# Patient Record
Sex: Female | Born: 1945 | Race: White | Hispanic: No | Marital: Married | Smoking: Former smoker
Health system: Southern US, Community
[De-identification: ages and names within clinical notes are randomized; demographics above are authoritative.]

## PROBLEM LIST (undated history)

## (undated) DIAGNOSIS — E785 Hyperlipidemia, unspecified: Secondary | ICD-10-CM

## (undated) HISTORY — PX: SHOULDER SURGERY: SHX246

## (undated) HISTORY — PX: FOOT SURGERY: SHX648

## (undated) HISTORY — PX: ABDOMINAL HYSTERECTOMY: SHX81

---

## 1998-05-29 ENCOUNTER — Other Ambulatory Visit: Admission: RE | Admit: 1998-05-29 | Discharge: 1998-05-29 | Payer: Self-pay | Admitting: Obstetrics and Gynecology

## 1999-03-19 ENCOUNTER — Inpatient Hospital Stay (HOSPITAL_COMMUNITY): Admission: RE | Admit: 1999-03-19 | Discharge: 1999-03-21 | Payer: Self-pay | Admitting: Obstetrics and Gynecology

## 1999-03-19 ENCOUNTER — Encounter (INDEPENDENT_AMBULATORY_CARE_PROVIDER_SITE_OTHER): Payer: Self-pay

## 1999-09-11 ENCOUNTER — Other Ambulatory Visit: Admission: RE | Admit: 1999-09-11 | Discharge: 1999-09-11 | Payer: Self-pay | Admitting: Family Medicine

## 2002-05-21 ENCOUNTER — Emergency Department (HOSPITAL_COMMUNITY): Admission: EM | Admit: 2002-05-21 | Discharge: 2002-05-21 | Payer: Self-pay | Admitting: Emergency Medicine

## 2003-05-07 ENCOUNTER — Other Ambulatory Visit: Admission: RE | Admit: 2003-05-07 | Discharge: 2003-05-07 | Payer: Self-pay | Admitting: Obstetrics and Gynecology

## 2005-05-20 ENCOUNTER — Other Ambulatory Visit: Admission: RE | Admit: 2005-05-20 | Discharge: 2005-05-20 | Payer: Self-pay | Admitting: Family Medicine

## 2007-11-07 ENCOUNTER — Encounter: Admission: RE | Admit: 2007-11-07 | Discharge: 2007-11-07 | Payer: Self-pay | Admitting: Family Medicine

## 2007-12-07 ENCOUNTER — Encounter: Admission: RE | Admit: 2007-12-07 | Discharge: 2007-12-07 | Payer: Self-pay | Admitting: Orthopedic Surgery

## 2009-11-02 IMAGING — CR DG LUMBAR SPINE COMPLETE 4+V
5 series · 5 of 5 positions shown · non-contrast
Comparison: None

CLINICAL DATA: Back pain and bilateral hip pain.

LUMBAR SPINE - COMPLETE 4+ VIEW

[t l-spine a.p.]
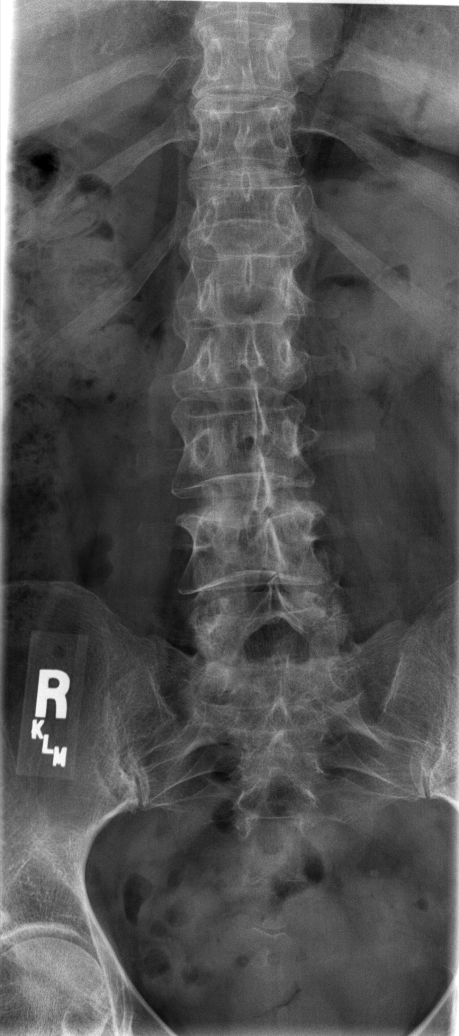

[t l-spine oblique exposure (1 of 2)]
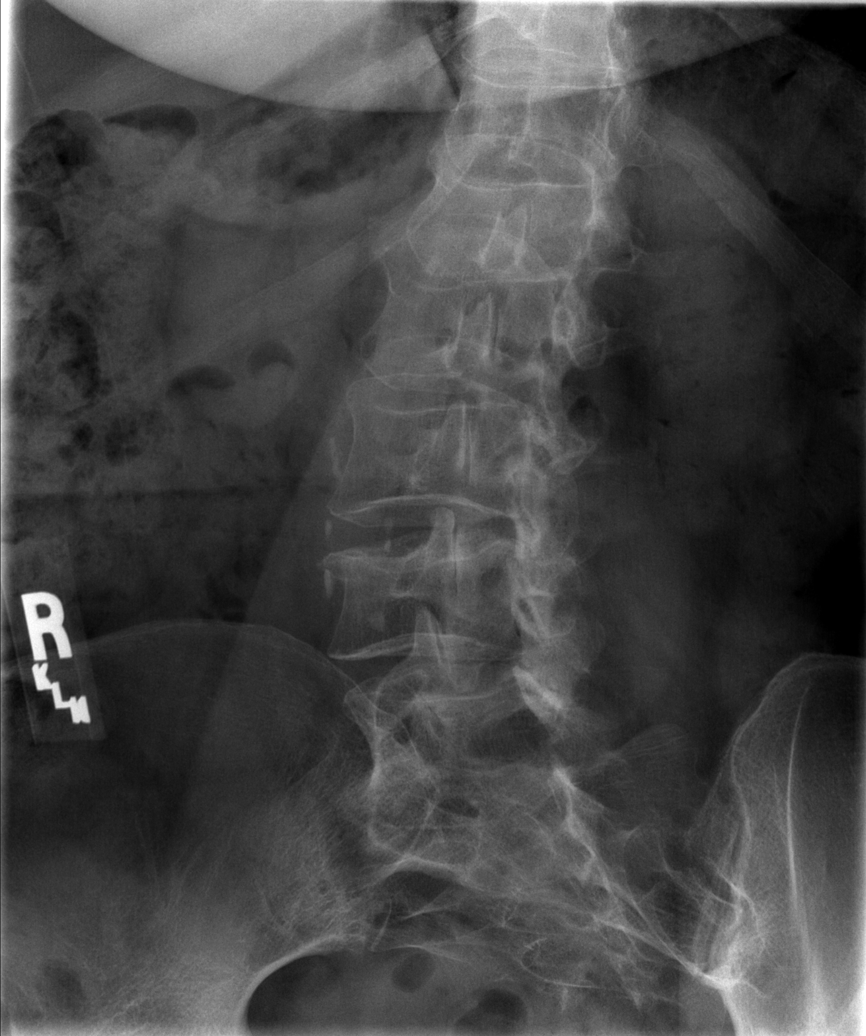

[t l-spine oblique exposure (2 of 2)]
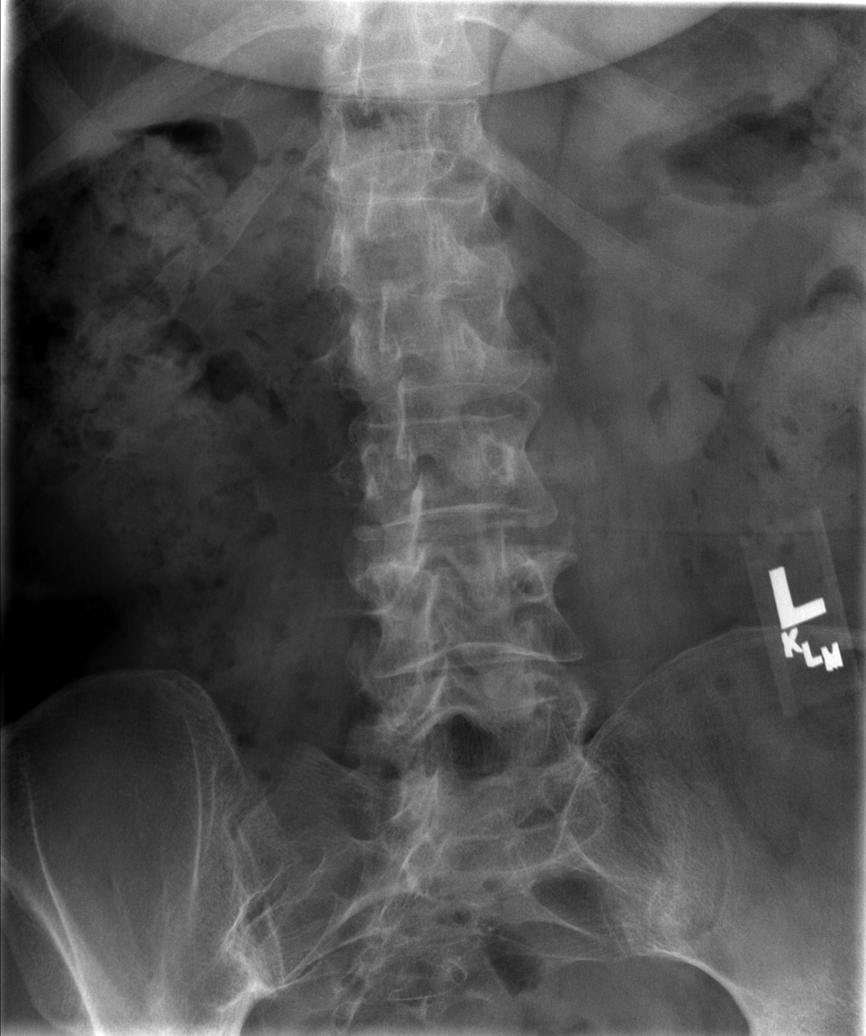

[t l-spine lat]
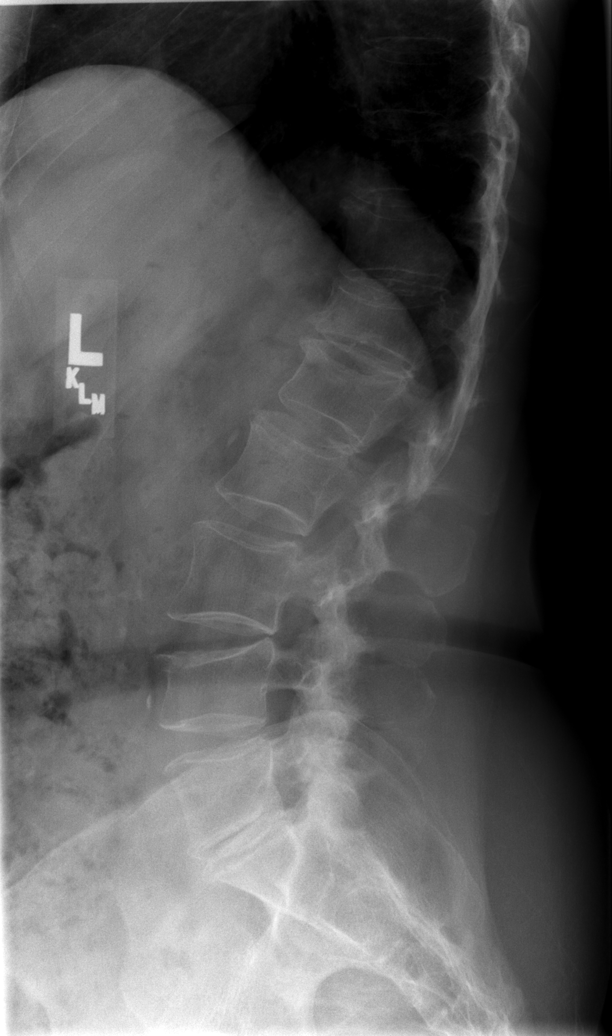

[t l-spine l5-s1 spot]
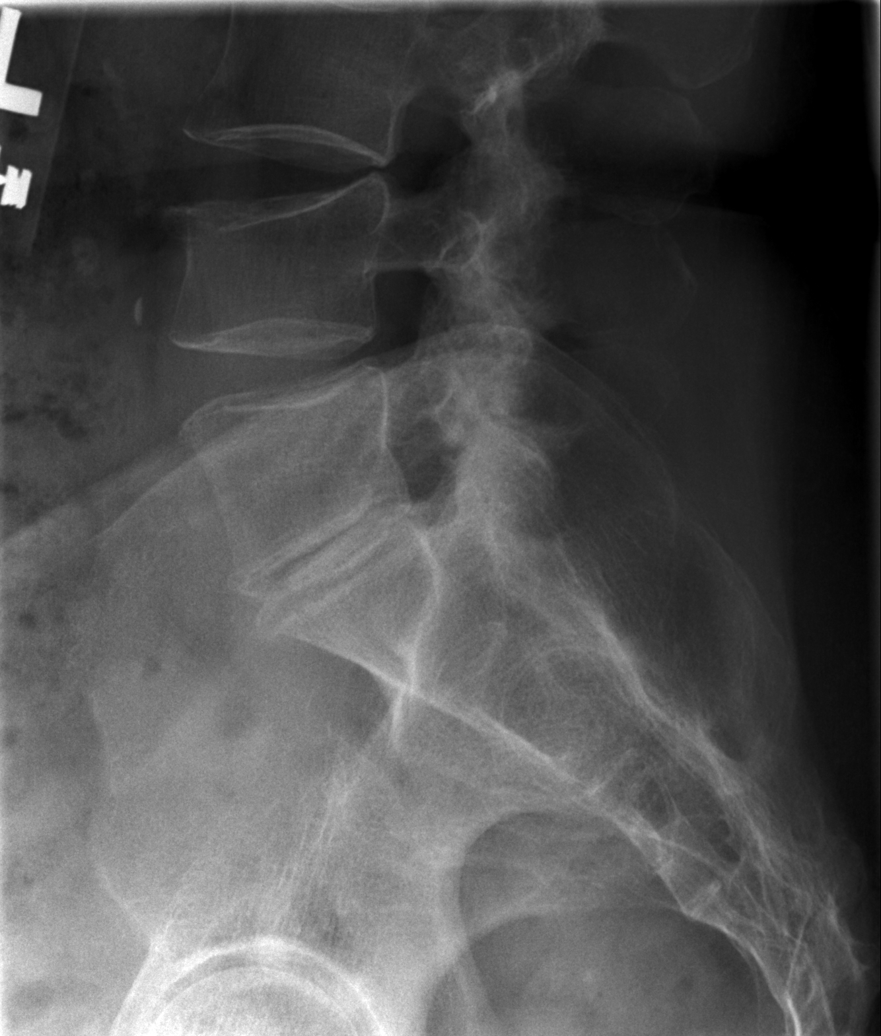

[5 of 5 positions shown; findings below may reference images not displayed]

FINDINGS: There is mild compression of the T12 vertebral body.
Mild compression of the superior endplate of L4 is seen as well.
Without prior imaging, these are considered age indeterminate.
Multilevel endplate degenerative changes and facet hypertrophy are
seen as well.  Loss of disc space height is worst at L5-S1.  Very
mild rightward curvature of the lumbar spine.
IMPRESSION: 1.  Age indeterminate compression fractures at T12 and L4, as
above.
2.  Spondylosis, worst at L5-S1.

## 2010-07-25 NOTE — Discharge Summary (Signed)
[facility] [address] [facility]  Patient:    Amber Norris                         MRN: [id number] Adm. Date:  [id number] Disc. Date: [id number] Attending:  Morene Antu                           Discharge Summary  PROBLEM:                      Vaginal prolapse.  SUBJECTIVE:                   Patient is a 65 year old gravida 2 para 2-0-0-2 woman who has had worsening vaginal prolapse over several years.  The patient has complained about this, with significant pelvic pain and pelvic pressure. Because of this, the patient requests surgical treatment at this time.  OBJECTIVE:  PHYSICAL EXAMINATION:  HEENT:                        Within normal limits.  NECK:                         Without any lymphadenopathy.  Thyroid without nodule.  CHEST:                        Clear to auscultation.  HEART:                        Respiratory rhythm without murmur.  BREAST:                       Without mass.  CVA:                          Nontender.  ABDOMEN:                      Soft, nontender, without mass.  PELVIC:                       External genitalia within normal limits.  Vagina,  third to fourth degree cystocele.  Cervix within normal limits.  Uterus mid normal size, nontender, mobile.  Adnexa without mass.  Rectovaginal exam without nodule.  HOSPITAL COURSE:              Patient was admitted and brought to the operating  room where a vaginal hysterectomy, bilateral salpingo-oophorectomy, and anterior and posterior repair were performed.  There were no complications from surgery, and postoperatively the patient did very well.  She remained afebrile and her vital  signs remained stable.  Pathology revealed cervix within normal limits; benign endometrium; myometrium, no pathological abnormalities.  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  Left fallopian tube and ovary normal.  Preoperative  hematocrit was 43.7, postoperative hematocrit on postoperative day #1 was 35.0.  Patient began urinating on postoperative day #2.  I&O catheterizations were performed for residuals.  Less  than 100 cc residual was present.  Therefore, the patient was able to be discharged to home without any self-catheterization.  ASSESSMENT:                   Stable,  status post vaginal hysterectomy, BSO, and anterior and posterior repair.  PLAN:                         Follow up in the office in two weeks.  The patient will call if she has a temperature greater than 101, severe pain, or heavy bleeding.  DISCHARGE MEDICATIONS:        She is discharged home on Tylox to take 1-2 q.4h. p.r.n. DD:  04/09/99 TD:  04/09/99 Job: [id number]

## 2010-07-25 NOTE — Op Note (Signed)
[facility]  Patient:    Amber Norris                         MRN: [id number] Proc. Date: 03/19/99 Adm. Date:  [id number] Attending:  Morene Antu                           Operative Report  PREOPERATIVE DIAGNOSIS:       Vaginal prolapse.  POSTOPERATIVE DIAGNOSIS:      Vaginal prolapse.  OPERATION:                    Vaginal hysterectomy, BSO, anterior and posterior  repair.  SURGEON:                      Sherry A. Rosalio Macadamia, M.D.  ASSISTANT:                    Sung Amabile. Roslyn Smiling, M.D.  ANESTHESIA:                   General anesthesia.  ESTIMATED BLOOD LOSS:  INDICATIONS:                  This is a 65 year old, gravida 2, para 2-0-0-2, woman who has had worsening vaginal prolapse over several years.  The patient has complained about this for several years, but has gotten significantly worse recently.  Because of the pelvic pressure and feeling as though the vaginal tissue is dropping out of the vagina, the patient requests hysterectomy and repair at his time.  FINDINGS:                     Third degree cervical prolapse.  Third to fourth degree cystocele.  Second degree rectocele.  Normal tubes and ovaries.  DESCRIPTION OF PROCEDURE:     The patient was brought into the operating room and given adequate general anesthesia.  She was placed in the dorsal lithotomy position.  Her perineum and vagina were washed with Betadine.  The bladder was in-and-out catheterized.  The patient was draped in a sterile fashion.  The speculum was placed within the vagina.  The cervix was grasped with a Jacobsen tenaculum and the cervix was circumcised circumferentially.  The vaginal mucosa was pushed off of the cervix.  The posterior peritoneum was entered sharply.  The uterosacral ligaments were clamped, cut, and suture ligated with 0 Vicryl ligatures.  The vaginal mucosa was dissected off the cervix.  The bladder pillars were clamped, cut, and  suture ligated with 0 Vicryl ligature.  The anterior peritoneum was then identified and entered.  A retractor was placed within the space.  On alternating sides, cardinal ligaments were clamped, cut, and suture ligated with 0 Vicryl ligatures.  The utero-ovarian ligaments were doubly clamped, cut, and suture ligated with 0 Vicryl and free tied with #1 Vicryl.  Once the uterus was removed in this fashion, the infundibulopelvic ligaments could be identified.  They were doubly clamped.  The tubes and ovaries were excised. They were tied with 1 Vicryl free tie and then suture ligated with 0 Vicryl ligatures. There was some small bleeding near the right infundibulopelvic ligament and this was free tied with 2-0 Vicryl.  The vaginal mucosa was then closed with #0 Vicryl in a running locked stitch.  There was still some bleeding near the right infundibulopelvic ligament.  A figure-of-eight suture was taken with 0 Vicryl and adequate hemostasis was present.  The peritoneal stitch was then placed with 2-0 Vicryl and left in place as a pursestring.  The uterosacral plication stitches ere taken across the posterior peritoneum x 2.  The stitches were then tied.  The peritoneal stitch was tied.  Cystocele was then repaired by clamping the edges of the vaginal mucosa in the midline and dissecting the vaginal mucosa off the bladder in the midline.  This was performed up to within about 1 cm of the urethra, but approximately 2 cm away from the meatus.  The vaginal mucosa was dissected off of the bladder with blunt and  sharp dissection.  The small bleeders were stopped by using 3-0 chromic in figure-of-eight stitches.  A Kelly plication stitch beneath the urethra was taken with #2-0 Vicryl in a mattress type stitch.  The plication stitches were taken across the bladder closing the endopelvic fascia beneath the bladder for support using 2-0 chromic stitches.  The excess vaginal mucosa was  excised.  The vaginal mucosa was then closed in the midline with 2-0 chromic in a running locked stitch. Adequate hemostasis was present.  Attention was turned to the small rectocele.  Allis clamps were placed on either side of the introitus.  The perineal epithelium was incised and excised in a triangular fashion.  The vaginal mucosa was dissected off of the rectum in the midline with blunt and sharp dissection and this was performed over approximately 4 to 5 cm.  The mucosa was incised in the midline and the vaginal mucosa was dissected free with blunt and sharp dissection laterally.  A pursestring stitch was taken with 2-0 chromic in a pursestring stitch across the endopelvic fascia for  support of the rectum.  The stitches were taken with #0 Vicryl in a mattress type stitches in the levator ani muscles for support across the perineum.  The excess vaginal tissue was excised.  The vagina was closed with 3-0 chromic in a running locked stitch.  The perineum was closed with 3-0 chromic in a subcutaneous subcuticular stitch.  Adequate hemostasis was present.  Foley catheter was placed in the bladder.  A two inch plain gauze was placed within the vagina for packing. The patient was taken out of the dorsal lithotomy position.  She was awakened. She was extubated.  She was removed from the operating table to a stretcher in stable condition.  Complications were none.  Estimated blood loss was 75 cc. DD:  03/19/99 TD:  03/19/99 Job: [id number]

## 2020-04-18 DIAGNOSIS — H401131 Primary open-angle glaucoma, bilateral, mild stage: Secondary | ICD-10-CM | POA: Diagnosis not present

## 2020-05-09 DIAGNOSIS — Z1231 Encounter for screening mammogram for malignant neoplasm of breast: Secondary | ICD-10-CM | POA: Diagnosis not present

## 2020-09-03 DIAGNOSIS — R194 Change in bowel habit: Secondary | ICD-10-CM | POA: Diagnosis not present

## 2020-09-03 DIAGNOSIS — Z86010 Personal history of colonic polyps: Secondary | ICD-10-CM | POA: Diagnosis not present

## 2020-09-03 DIAGNOSIS — E739 Lactose intolerance, unspecified: Secondary | ICD-10-CM | POA: Diagnosis not present

## 2020-10-22 DIAGNOSIS — H401131 Primary open-angle glaucoma, bilateral, mild stage: Secondary | ICD-10-CM | POA: Diagnosis not present

## 2020-11-12 DIAGNOSIS — H43813 Vitreous degeneration, bilateral: Secondary | ICD-10-CM | POA: Diagnosis not present

## 2020-11-12 DIAGNOSIS — H401131 Primary open-angle glaucoma, bilateral, mild stage: Secondary | ICD-10-CM | POA: Diagnosis not present

## 2020-11-12 DIAGNOSIS — H2513 Age-related nuclear cataract, bilateral: Secondary | ICD-10-CM | POA: Diagnosis not present

## 2020-12-02 DIAGNOSIS — N390 Urinary tract infection, site not specified: Secondary | ICD-10-CM | POA: Diagnosis not present

## 2021-01-01 DIAGNOSIS — Z23 Encounter for immunization: Secondary | ICD-10-CM | POA: Diagnosis not present

## 2021-01-01 DIAGNOSIS — K589 Irritable bowel syndrome without diarrhea: Secondary | ICD-10-CM | POA: Diagnosis not present

## 2021-01-01 DIAGNOSIS — H409 Unspecified glaucoma: Secondary | ICD-10-CM | POA: Diagnosis not present

## 2021-01-01 DIAGNOSIS — M79672 Pain in left foot: Secondary | ICD-10-CM | POA: Diagnosis not present

## 2021-01-01 DIAGNOSIS — K219 Gastro-esophageal reflux disease without esophagitis: Secondary | ICD-10-CM | POA: Diagnosis not present

## 2021-01-01 DIAGNOSIS — D229 Melanocytic nevi, unspecified: Secondary | ICD-10-CM | POA: Diagnosis not present

## 2021-01-01 DIAGNOSIS — Z0000 Encounter for general adult medical examination without abnormal findings: Secondary | ICD-10-CM | POA: Diagnosis not present

## 2021-01-01 DIAGNOSIS — D241 Benign neoplasm of right breast: Secondary | ICD-10-CM | POA: Diagnosis not present

## 2021-01-01 DIAGNOSIS — M8580 Other specified disorders of bone density and structure, unspecified site: Secondary | ICD-10-CM | POA: Diagnosis not present

## 2021-01-01 DIAGNOSIS — Z1389 Encounter for screening for other disorder: Secondary | ICD-10-CM | POA: Diagnosis not present

## 2021-01-01 DIAGNOSIS — E7800 Pure hypercholesterolemia, unspecified: Secondary | ICD-10-CM | POA: Diagnosis not present

## 2021-01-01 DIAGNOSIS — M150 Primary generalized (osteo)arthritis: Secondary | ICD-10-CM | POA: Diagnosis not present

## 2021-01-02 DIAGNOSIS — H2512 Age-related nuclear cataract, left eye: Secondary | ICD-10-CM | POA: Diagnosis not present

## 2021-01-02 DIAGNOSIS — H401121 Primary open-angle glaucoma, left eye, mild stage: Secondary | ICD-10-CM | POA: Diagnosis not present

## 2021-01-14 DIAGNOSIS — H2511 Age-related nuclear cataract, right eye: Secondary | ICD-10-CM | POA: Diagnosis not present

## 2021-01-14 DIAGNOSIS — N6489 Other specified disorders of breast: Secondary | ICD-10-CM | POA: Diagnosis not present

## 2021-01-14 DIAGNOSIS — R928 Other abnormal and inconclusive findings on diagnostic imaging of breast: Secondary | ICD-10-CM | POA: Diagnosis not present

## 2021-01-16 DIAGNOSIS — H401111 Primary open-angle glaucoma, right eye, mild stage: Secondary | ICD-10-CM | POA: Diagnosis not present

## 2021-01-16 DIAGNOSIS — H2511 Age-related nuclear cataract, right eye: Secondary | ICD-10-CM | POA: Diagnosis not present

## 2021-02-10 ENCOUNTER — Ambulatory Visit: Payer: Medicare Other | Admitting: Podiatry

## 2021-03-31 DIAGNOSIS — L570 Actinic keratosis: Secondary | ICD-10-CM | POA: Diagnosis not present

## 2021-03-31 DIAGNOSIS — D1801 Hemangioma of skin and subcutaneous tissue: Secondary | ICD-10-CM | POA: Diagnosis not present

## 2021-03-31 DIAGNOSIS — L309 Dermatitis, unspecified: Secondary | ICD-10-CM | POA: Diagnosis not present

## 2021-03-31 DIAGNOSIS — L578 Other skin changes due to chronic exposure to nonionizing radiation: Secondary | ICD-10-CM | POA: Diagnosis not present

## 2021-03-31 DIAGNOSIS — L821 Other seborrheic keratosis: Secondary | ICD-10-CM | POA: Diagnosis not present

## 2021-04-23 DIAGNOSIS — H401131 Primary open-angle glaucoma, bilateral, mild stage: Secondary | ICD-10-CM | POA: Diagnosis not present

## 2021-05-20 DIAGNOSIS — R300 Dysuria: Secondary | ICD-10-CM | POA: Diagnosis not present

## 2021-05-22 DIAGNOSIS — Z1231 Encounter for screening mammogram for malignant neoplasm of breast: Secondary | ICD-10-CM | POA: Diagnosis not present

## 2021-06-19 DIAGNOSIS — S68626A Partial traumatic transphalangeal amputation of right little finger, initial encounter: Secondary | ICD-10-CM | POA: Diagnosis not present

## 2021-06-19 DIAGNOSIS — Z23 Encounter for immunization: Secondary | ICD-10-CM | POA: Diagnosis not present

## 2021-06-19 DIAGNOSIS — S61316A Laceration without foreign body of right little finger with damage to nail, initial encounter: Secondary | ICD-10-CM | POA: Diagnosis not present

## 2021-07-07 DIAGNOSIS — E7800 Pure hypercholesterolemia, unspecified: Secondary | ICD-10-CM | POA: Diagnosis not present

## 2021-08-18 DIAGNOSIS — H401131 Primary open-angle glaucoma, bilateral, mild stage: Secondary | ICD-10-CM | POA: Diagnosis not present

## 2021-09-26 DIAGNOSIS — H401131 Primary open-angle glaucoma, bilateral, mild stage: Secondary | ICD-10-CM | POA: Diagnosis not present

## 2021-10-27 DIAGNOSIS — H401131 Primary open-angle glaucoma, bilateral, mild stage: Secondary | ICD-10-CM | POA: Diagnosis not present

## 2022-01-21 DIAGNOSIS — R079 Chest pain, unspecified: Secondary | ICD-10-CM | POA: Diagnosis not present

## 2022-02-05 ENCOUNTER — Encounter: Payer: Self-pay | Admitting: Emergency Medicine

## 2022-02-05 ENCOUNTER — Other Ambulatory Visit: Payer: Self-pay

## 2022-02-05 ENCOUNTER — Ambulatory Visit
Admission: EM | Admit: 2022-02-05 | Discharge: 2022-02-05 | Disposition: A | Discharge status: Home / Self Care | Payer: Medicare Other | Attending: Family Medicine | Admitting: Family Medicine

## 2022-02-05 ENCOUNTER — Ambulatory Visit (INDEPENDENT_AMBULATORY_CARE_PROVIDER_SITE_OTHER): Payer: Medicare Other

## 2022-02-05 DIAGNOSIS — S32020A Wedge compression fracture of second lumbar vertebra, initial encounter for closed fracture: Secondary | ICD-10-CM | POA: Diagnosis not present

## 2022-02-05 DIAGNOSIS — M8000XA Age-related osteoporosis with current pathological fracture, unspecified site, initial encounter for fracture: Secondary | ICD-10-CM | POA: Diagnosis not present

## 2022-02-05 DIAGNOSIS — M546 Pain in thoracic spine: Secondary | ICD-10-CM | POA: Diagnosis not present

## 2022-02-05 DIAGNOSIS — M40204 Unspecified kyphosis, thoracic region: Secondary | ICD-10-CM | POA: Diagnosis not present

## 2022-02-05 DIAGNOSIS — M549 Dorsalgia, unspecified: Secondary | ICD-10-CM | POA: Diagnosis not present

## 2022-02-05 DIAGNOSIS — M47816 Spondylosis without myelopathy or radiculopathy, lumbar region: Secondary | ICD-10-CM | POA: Diagnosis not present

## 2022-02-05 DIAGNOSIS — M545 Low back pain, unspecified: Secondary | ICD-10-CM | POA: Diagnosis not present

## 2022-02-05 HISTORY — DX: Hyperlipidemia, unspecified: E78.5

## 2022-02-05 MED ORDER — HYDROMORPHONE HCL 2 MG PO TABS: 2.0000 mg | ORAL_TABLET | ORAL | 0 refills | Status: DC | PRN

## 2022-02-05 MED ORDER — OXYCODONE-ACETAMINOPHEN 5-325 MG PO TABS: 1.0000 | ORAL_TABLET | Freq: Four times a day (QID) | ORAL | 0 refills | Status: DC | PRN

## 2022-02-05 NOTE — ED Provider Notes (Signed)
[facility]    CSN: [id number] Arrival date & time: 02/05/22  0933      History   Chief Complaint Chief Complaint  Patient presents with   Back Injury    HPI Amber Norris is a 76 y.o. female.   HPI  Patient states that she has had fractures in her spine off and on for many years.  They are documented on imaging done in 2009.  Patient states that she does not, however, have osteoporosis but osteopenia.  She is not on any treatment for osteoporosis. Patient is here because she did some heavy lifting yesterday.  She is bent over and picked up a heavy piece of furniture she felt a pop and immediate pain in her mid and low back.  She thinks she has broken another bone in her back.  She is here for evaluation.  She has not seen a spine specialist for any of her spine fractures.  Denies numbness or weakness in arms or legs states intolerance to all codeine derivatives  Past Medical History:  Diagnosis Date   Hyperlipemia     There are no problems to display for this patient.   Past Surgical History:  Procedure Laterality Date   ABDOMINAL HYSTERECTOMY     FOOT SURGERY     SHOULDER SURGERY      OB History   No obstetric history on file.      Home Medications    Prior to Admission medications   Medication Sig Start Date End Date Taking? Authorizing Provider  HYDROmorphone (DILAUDID) 2 MG tablet Take 1 tablet (2 mg total) by mouth every 4 (four) hours as needed for severe pain. 02/05/22  Yes Raylene Everts, MD  simvastatin (ZOCOR) 20 MG tablet Take 20 mg by mouth daily.   Yes [provider]    Family History History reviewed. No pertinent family history.  Social History Social History   Tobacco Use   Smoking status: Former    Types: Cigarettes    Quit date: 2009    Years since quitting: 14.9   Smokeless tobacco: Never  Vaping Use   Vaping Use: Never used  Substance Use Topics   Alcohol use: Not Currently   Drug use: Never      Allergies   Codeine   Review of Systems Review of Systems See HPI  Physical Exam Triage Vital Signs ED Triage Vitals  Enc Vitals Group     BP 02/05/22 0953 137/78     Pulse Rate 02/05/22 0953 77     Resp 02/05/22 0953 16     Temp 02/05/22 0953 97.9 F (36.6 C)     Temp Source 02/05/22 0953 Oral     SpO2 02/05/22 0953 99 %     Weight 02/05/22 0954 132 lb (59.9 kg)     Height 02/05/22 0954 '5\' 5"'$  (1.651 m)     Head Circumference --      Peak Flow --      Pain Score 02/05/22 0953 7     Pain Loc --      Pain Edu? --      Excl. in [place]? --    No data found.  Updated Vital Signs BP 137/78 (BP Location: Left Arm)   Pulse 77   Temp 97.9 F (36.6 C) (Oral)   Resp 16   Ht '5\' 5"'$  (1.651 m)   Wt 59.9 kg   SpO2 99%   BMI 21.97 kg/m  Physical Exam Constitutional:      General: She is not in acute distress.    Appearance: She is well-developed. She is ill-appearing.     Comments: Appears uncomfortable.  Guarded movements  HENT:     Head: Normocephalic and atraumatic.  Eyes:     Conjunctiva/sclera: Conjunctivae normal.     Pupils: Pupils are equal, round, and reactive to light.  Cardiovascular:     Rate and Rhythm: Normal rate.  Pulmonary:     Effort: Pulmonary effort is normal. No respiratory distress.  Abdominal:     General: There is no distension.     Palpations: Abdomen is soft.  Musculoskeletal:        General: Normal range of motion.       Arms:     Cervical back: Normal range of motion.  Skin:    General: Skin is warm and dry.  Neurological:     Mental Status: She is alert.      UC Treatments / Results  Labs (all labs ordered are listed, but only abnormal results are displayed) Labs Reviewed - No data to display  EKG   Radiology DG Thoracic Spine 2 View  Result Date: 02/05/2022 CLINICAL DATA:  Lumbar compression fracture. Thoracolumbar back pain EXAM: THORACIC SPINE 2 VIEWS COMPARISON:  MRI lumbar spine 12/07/2007 FINDINGS: Remote  appearing compression deformities of T12 and L1 appears stable since prior MRI examination. Remote appearing T7 compression deformity is present with approximately 50% loss of height. Normal thoracic kyphosis. No acute fracture or listhesis of the thoracic spine. Remaining vertebral body height is preserved. Mild endplate changes are seen within the lower thoracic spine in keeping with changes of mild degenerative disc disease. Intervertebral disc heights are preserved. Paraspinal soft tissues are unremarkable. IMPRESSION: 1. No acute fracture or listhesis of the thoracic spine. 2. Remote appearing T7 compression deformity with approximately 50% loss of height. 3. Stable remote appearing T12 and L1 compression deformities. Electronically Signed   By: Fidela Salisbury M.D.   On: 02/05/2022 10:36   DG Lumbar Spine 2-3 Views  Result Date: 02/05/2022 CLINICAL DATA:  Back pain EXAM: LUMBAR SPINE - 2-3 VIEW COMPARISON:  11/07/2007 FINDINGS: Five lumbar type vertebral segments. Chronic mild superior endplate compression fractures of T12, L1, and L4. Slight superior endplate depression at L2 is new since 2009. Lumbar dextrocurvature. No traumatic listhesis. Mild disc height loss with moderate lower lumbar facet arthropathy. IMPRESSION: 1. Slight superior endplate depression at L2 is new since 2009. Correlate with point tenderness. 2. Chronic mild superior endplate compression fractures of T12, L1, and L4. Electronically Signed   By: Davina Poke D.O.   On: 02/05/2022 10:33    Procedures Procedures (including critical care time)  Medications Ordered in UC Medications - No data to display  Initial Impression / Assessment and Plan / UC Course  I have reviewed the triage vital signs and the nursing notes.  Pertinent labs & imaging results that were available during my care of the patient were reviewed by me and considered in my medical decision making (see chart for details).    Patient's husband has had  surgery by Dr. Newman Pies of [facility].  Patient request to see Dr. Arnoldo Morale.  I told her that I will refer the paperwork, but it is up to Dr. Arnoldo Morale while he will see her.  I am certain somebody in the office will help care for her spine condition. Final Clinical Impressions(s) / UC Diagnoses  Final diagnoses:  Compression fracture of L2 vertebra, initial encounter ([place])  Age-related osteoporosis with current pathological fracture, initial encounter     Discharge Instructions      Avoid bending and lifting activities Take Tylenol for mild pain Take oxycodone for severe pain Do not drive on oxycodone Watch out for constipation Dr. Arnoldo Morale with [facility] is a good person to see you in follow-up.  Call his office if you have not heard from them by Monday.  [phone]   ED Prescriptions     Medication Sig Dispense Auth. Provider   oxyCODONE-acetaminophen (PERCOCET/ROXICET) 5-325 MG tablet  (Status: Discontinued) Take 1 tablet by mouth every 6 (six) hours as needed for severe pain. 15 tablet Raylene Everts, MD   HYDROmorphone (DILAUDID) 2 MG tablet Take 1 tablet (2 mg total) by mouth every 4 (four) hours as needed for severe pain. 20 tablet Raylene Everts, MD      I have reviewed the PDMP during this encounter.   Raylene Everts, MD 02/05/22 [phone]

## 2022-02-05 NOTE — Discharge Instructions (Signed)
Avoid bending and lifting activities Take Tylenol for mild pain Take oxycodone for severe pain Do not drive on oxycodone Watch out for constipation Dr. Arnoldo Morale with [facility] is a good person to see you in follow-up.  Call his office if you have not heard from them by Monday.  [phone]

## 2022-02-05 NOTE — ED Triage Notes (Signed)
Lifting a piece of furniture yesterday, heard pop in mid back, previous injury. Felt pain immediately.

## 2022-02-12 DIAGNOSIS — S32000D Wedge compression fracture of unspecified lumbar vertebra, subsequent encounter for fracture with routine healing: Secondary | ICD-10-CM | POA: Diagnosis not present

## 2022-02-16 DIAGNOSIS — S22080A Wedge compression fracture of T11-T12 vertebra, initial encounter for closed fracture: Secondary | ICD-10-CM | POA: Diagnosis not present

## 2022-02-16 DIAGNOSIS — S32010A Wedge compression fracture of first lumbar vertebra, initial encounter for closed fracture: Secondary | ICD-10-CM | POA: Diagnosis not present

## 2022-02-16 DIAGNOSIS — H401131 Primary open-angle glaucoma, bilateral, mild stage: Secondary | ICD-10-CM | POA: Diagnosis not present

## 2022-02-18 ENCOUNTER — Ambulatory Visit
Admission: RE | Admit: 2022-02-18 | Discharge: 2022-02-18 | Disposition: A | Discharge status: Home / Self Care | Payer: Medicare Other | Source: Ambulatory Visit | Attending: Internal Medicine | Admitting: Internal Medicine

## 2022-02-18 ENCOUNTER — Other Ambulatory Visit: Payer: Self-pay | Admitting: Internal Medicine

## 2022-02-18 DIAGNOSIS — Z0000 Encounter for general adult medical examination without abnormal findings: Secondary | ICD-10-CM | POA: Diagnosis not present

## 2022-02-18 DIAGNOSIS — R058 Other specified cough: Secondary | ICD-10-CM

## 2022-02-18 DIAGNOSIS — D241 Benign neoplasm of right breast: Secondary | ICD-10-CM | POA: Diagnosis not present

## 2022-02-18 DIAGNOSIS — S32010S Wedge compression fracture of first lumbar vertebra, sequela: Secondary | ICD-10-CM | POA: Diagnosis not present

## 2022-02-18 DIAGNOSIS — E7800 Pure hypercholesterolemia, unspecified: Secondary | ICD-10-CM | POA: Diagnosis not present

## 2022-02-18 DIAGNOSIS — S22080S Wedge compression fracture of T11-T12 vertebra, sequela: Secondary | ICD-10-CM | POA: Diagnosis not present

## 2022-02-18 DIAGNOSIS — Z13820 Encounter for screening for osteoporosis: Secondary | ICD-10-CM | POA: Diagnosis not present

## 2022-02-18 DIAGNOSIS — K219 Gastro-esophageal reflux disease without esophagitis: Secondary | ICD-10-CM | POA: Diagnosis not present

## 2022-02-18 DIAGNOSIS — H409 Unspecified glaucoma: Secondary | ICD-10-CM | POA: Diagnosis not present

## 2022-02-18 DIAGNOSIS — R7301 Impaired fasting glucose: Secondary | ICD-10-CM | POA: Diagnosis not present

## 2022-02-18 DIAGNOSIS — K589 Irritable bowel syndrome without diarrhea: Secondary | ICD-10-CM | POA: Diagnosis not present

## 2022-02-18 DIAGNOSIS — R059 Cough, unspecified: Secondary | ICD-10-CM | POA: Diagnosis not present

## 2022-02-18 DIAGNOSIS — R053 Chronic cough: Secondary | ICD-10-CM | POA: Diagnosis not present

## 2022-02-18 DIAGNOSIS — M150 Primary generalized (osteo)arthritis: Secondary | ICD-10-CM | POA: Diagnosis not present

## 2022-02-18 DIAGNOSIS — M8580 Other specified disorders of bone density and structure, unspecified site: Secondary | ICD-10-CM | POA: Diagnosis not present

## 2022-02-19 ENCOUNTER — Other Ambulatory Visit: Payer: Self-pay | Admitting: Internal Medicine

## 2022-02-19 DIAGNOSIS — R9389 Abnormal findings on diagnostic imaging of other specified body structures: Secondary | ICD-10-CM

## 2022-02-19 DIAGNOSIS — R918 Other nonspecific abnormal finding of lung field: Secondary | ICD-10-CM

## 2022-02-20 ENCOUNTER — Other Ambulatory Visit: Payer: Self-pay | Admitting: Internal Medicine

## 2022-02-20 DIAGNOSIS — R918 Other nonspecific abnormal finding of lung field: Secondary | ICD-10-CM

## 2022-02-20 DIAGNOSIS — R9389 Abnormal findings on diagnostic imaging of other specified body structures: Secondary | ICD-10-CM

## 2022-02-23 ENCOUNTER — Ambulatory Visit (INDEPENDENT_AMBULATORY_CARE_PROVIDER_SITE_OTHER): Payer: Medicare Other

## 2022-02-23 DIAGNOSIS — R9389 Abnormal findings on diagnostic imaging of other specified body structures: Secondary | ICD-10-CM

## 2022-02-23 DIAGNOSIS — R918 Other nonspecific abnormal finding of lung field: Secondary | ICD-10-CM | POA: Diagnosis not present

## 2022-02-23 DIAGNOSIS — J479 Bronchiectasis, uncomplicated: Secondary | ICD-10-CM | POA: Diagnosis not present

## 2022-02-23 DIAGNOSIS — J984 Other disorders of lung: Secondary | ICD-10-CM | POA: Diagnosis not present

## 2022-02-25 DIAGNOSIS — M545 Low back pain, unspecified: Secondary | ICD-10-CM | POA: Diagnosis not present

## 2022-02-25 DIAGNOSIS — M47816 Spondylosis without myelopathy or radiculopathy, lumbar region: Secondary | ICD-10-CM | POA: Diagnosis not present

## 2022-02-25 DIAGNOSIS — M4316 Spondylolisthesis, lumbar region: Secondary | ICD-10-CM | POA: Diagnosis not present

## 2022-02-25 DIAGNOSIS — S32010A Wedge compression fracture of first lumbar vertebra, initial encounter for closed fracture: Secondary | ICD-10-CM | POA: Diagnosis not present

## 2022-03-13 DIAGNOSIS — G8929 Other chronic pain: Secondary | ICD-10-CM | POA: Diagnosis not present

## 2022-03-13 DIAGNOSIS — M85852 Other specified disorders of bone density and structure, left thigh: Secondary | ICD-10-CM | POA: Diagnosis not present

## 2022-03-13 DIAGNOSIS — Z780 Asymptomatic menopausal state: Secondary | ICD-10-CM | POA: Diagnosis not present

## 2022-03-13 DIAGNOSIS — M810 Age-related osteoporosis without current pathological fracture: Secondary | ICD-10-CM | POA: Diagnosis not present

## 2022-03-24 ENCOUNTER — Ambulatory Visit
Admission: RE | Admit: 2022-03-24 | Discharge: 2022-03-24 | Disposition: A | Discharge status: Home / Self Care | Payer: Medicare Other | Source: Ambulatory Visit | Attending: Internal Medicine | Admitting: Internal Medicine

## 2022-03-24 DIAGNOSIS — I700 Atherosclerosis of aorta: Secondary | ICD-10-CM | POA: Diagnosis not present

## 2022-03-24 DIAGNOSIS — R918 Other nonspecific abnormal finding of lung field: Secondary | ICD-10-CM

## 2022-03-24 DIAGNOSIS — J9811 Atelectasis: Secondary | ICD-10-CM | POA: Diagnosis not present

## 2022-03-24 DIAGNOSIS — R9389 Abnormal findings on diagnostic imaging of other specified body structures: Secondary | ICD-10-CM

## 2022-03-30 DIAGNOSIS — M810 Age-related osteoporosis without current pathological fracture: Secondary | ICD-10-CM | POA: Diagnosis not present

## 2022-03-30 DIAGNOSIS — S22080S Wedge compression fracture of T11-T12 vertebra, sequela: Secondary | ICD-10-CM | POA: Diagnosis not present

## 2022-03-30 DIAGNOSIS — S32010S Wedge compression fracture of first lumbar vertebra, sequela: Secondary | ICD-10-CM | POA: Diagnosis not present

## 2022-03-30 DIAGNOSIS — K449 Diaphragmatic hernia without obstruction or gangrene: Secondary | ICD-10-CM | POA: Diagnosis not present

## 2022-03-30 DIAGNOSIS — A310 Pulmonary mycobacterial infection: Secondary | ICD-10-CM | POA: Diagnosis not present

## 2022-04-06 DIAGNOSIS — L570 Actinic keratosis: Secondary | ICD-10-CM | POA: Diagnosis not present

## 2022-04-06 DIAGNOSIS — L821 Other seborrheic keratosis: Secondary | ICD-10-CM | POA: Diagnosis not present

## 2022-04-06 DIAGNOSIS — L578 Other skin changes due to chronic exposure to nonionizing radiation: Secondary | ICD-10-CM | POA: Diagnosis not present

## 2022-04-06 DIAGNOSIS — L820 Inflamed seborrheic keratosis: Secondary | ICD-10-CM | POA: Diagnosis not present

## 2022-04-14 DIAGNOSIS — M545 Low back pain, unspecified: Secondary | ICD-10-CM | POA: Diagnosis not present

## 2022-04-14 DIAGNOSIS — G8929 Other chronic pain: Secondary | ICD-10-CM | POA: Diagnosis not present

## 2022-04-24 ENCOUNTER — Ambulatory Visit: Payer: Medicare Other | Admitting: Pulmonary Disease

## 2022-04-24 ENCOUNTER — Encounter: Payer: Self-pay | Admitting: Pulmonary Disease

## 2022-04-24 VITALS — BP 140/80 | HR 72 | Ht 65.0 in | Wt 129.8 lb

## 2022-04-24 DIAGNOSIS — J479 Bronchiectasis, uncomplicated: Secondary | ICD-10-CM | POA: Diagnosis not present

## 2022-04-24 DIAGNOSIS — R918 Other nonspecific abnormal finding of lung field: Secondary | ICD-10-CM | POA: Diagnosis not present

## 2022-04-24 MED ORDER — ALBUTEROL SULFATE (2.5 MG/3ML) 0.083% IN NEBU: 2.5000 mg | INHALATION_SOLUTION | Freq: Four times a day (QID) | RESPIRATORY_TRACT | 12 refills | Status: DC | PRN

## 2022-04-24 NOTE — Progress Notes (Signed)
Synopsis: Referred in February 2024 for MAI by Mckinley Jewel, MD  Subjective:   PATIENT ID: Amber Norris GENDER: female DOB: 09-Feb-1946, MRN: [id number]  Chief Complaint  Patient presents with   Consult    MAI in lungs    This is a 77 year old female, past medical history of hyperlipidemia referred for abnormal CT imaging.Patient had CT scan of the chest on 03/24/2022 which had some areas of apical scarring, some bronchiectatic changes and peribronchial thickening within the lingula and some scattered airspace disease within the right middle lobe.  These areas were concerning for possible underlying MAI.  The patient has daily sputum production.  She has daily cough.  And has lost approximately 5 pounds.  She believes that her sisters of years ago had the same disease.  She has not herself had recurrent episodes of pneumonia.    Past Medical History:  Diagnosis Date   Hyperlipemia      History reviewed. No pertinent family history.   Past Surgical History:  Procedure Laterality Date   ABDOMINAL HYSTERECTOMY     FOOT SURGERY     SHOULDER SURGERY      Social History   Socioeconomic History   Marital status: Married    Spouse name: Not on file   Number of children: Not on file   Years of education: Not on file   Highest education level: Not on file  Occupational History   Not on file  Tobacco Use   Smoking status: Former    Types: Cigarettes    Quit date: 2009    Years since quitting: 15.1   Smokeless tobacco: Never  Vaping Use   Vaping Use: Never used  Substance and Sexual Activity   Alcohol use: Not Currently   Drug use: Never   Sexual activity: Not on file  Other Topics Concern   Not on file  Social History Narrative   Not on file   Social Determinants of Health   Financial Resource Strain: Not on file  Food Insecurity: Not on file  Transportation Needs: Not on file  Physical Activity: Not on file  Stress: Not on file  Social Connections: Not on  file  Intimate Partner Violence: Not on file     Allergies  Allergen Reactions   Codeine      Outpatient Medications Prior to Visit  Medication Sig Dispense Refill   HYDROmorphone (DILAUDID) 2 MG tablet Take 1 tablet (2 mg total) by mouth every 4 (four) hours as needed for severe pain. (Patient not taking: Reported on 04/24/2022) 20 tablet 0   simvastatin (ZOCOR) 20 MG tablet Take 20 mg by mouth daily.     No facility-administered medications prior to visit.    Review of Systems  Constitutional:  Negative for chills, fever, malaise/fatigue and weight loss.  HENT:  Negative for hearing loss, sore throat and tinnitus.   Eyes:  Negative for blurred vision and double vision.  Respiratory:  Positive for cough, sputum production and shortness of breath. Negative for hemoptysis, wheezing and stridor.   Cardiovascular:  Negative for chest pain, palpitations, orthopnea, leg swelling and PND.  Gastrointestinal:  Negative for abdominal pain, constipation, diarrhea, heartburn, nausea and vomiting.  Genitourinary:  Negative for dysuria, hematuria and urgency.  Musculoskeletal:  Negative for joint pain and myalgias.  Skin:  Negative for itching and rash.  Neurological:  Negative for dizziness, tingling, weakness and headaches.  Endo/Heme/Allergies:  Negative for environmental allergies. Does not bruise/bleed easily.  Psychiatric/Behavioral:  Negative  for depression. The patient is not nervous/anxious and does not have insomnia.   All other systems reviewed and are negative.    Objective:  Physical Exam Vitals reviewed.  Constitutional:      General: She is not in acute distress.    Appearance: She is well-developed.     Comments: Thin elderly  HENT:     Head: Normocephalic and atraumatic.  Eyes:     General: No scleral icterus.    Conjunctiva/sclera: Conjunctivae normal.     Pupils: Pupils are equal, round, and reactive to light.  Neck:     Vascular: No JVD.     Trachea: No tracheal  deviation.  Cardiovascular:     Rate and Rhythm: Normal rate and regular rhythm.     Heart sounds: Normal heart sounds. No murmur heard. Pulmonary:     Effort: Pulmonary effort is normal. No tachypnea, accessory muscle usage or respiratory distress.     Breath sounds: No stridor. No wheezing, rhonchi or rales.  Abdominal:     General: There is no distension.     Palpations: Abdomen is soft.     Tenderness: There is no abdominal tenderness.  Musculoskeletal:        General: No tenderness.     Cervical back: Neck supple.  Lymphadenopathy:     Cervical: No cervical adenopathy.  Skin:    General: Skin is warm and dry.     Capillary Refill: Capillary refill takes less than 2 seconds.     Findings: No rash.  Neurological:     Mental Status: She is alert and oriented to person, place, and time.  Psychiatric:        Behavior: Behavior normal.      Vitals:   04/24/22 1017  BP: (!) 140/80  Pulse: 72  SpO2: 96%  Weight: 129 lb 12.8 oz (58.9 kg)  Height: 5' 5"$  (1.651 m)   96% on RA BMI Readings from Last 3 Encounters:  04/24/22 21.60 kg/m  02/05/22 21.97 kg/m   Wt Readings from Last 3 Encounters:  04/24/22 129 lb 12.8 oz (58.9 kg)  02/05/22 132 lb (59.9 kg)     CBC No results found for: "WBC", "RBC", "HGB", "HCT", "PLT", "MCV", "MCH", "MCHC", "RDW", "LYMPHSABS", "MONOABS", "EOSABS", "BASOSABS"   Chest Imaging: 03/24/2022 CT chest: Scattered infiltrate within lingula and right middle lobe possible underlying MAI. The patient's images have been independently reviewed by me.    Pulmonary Functions Testing Results:     No data to display          FeNO:   Pathology:   Echocardiogram:   Heart Catheterization:     Assessment & Plan:     ICD-10-CM   1. Bronchiectasis without complication ([place])  A999333     2. Pulmonary infiltrate  R91.8       Discussion:  This is a 77 year old female, past medical history of bronchiectasis and pulmonary infiltrates within  the right middle lobe and lingula.  She has had a chronic ongoing cough with daily sputum production and 5 pound weight loss.  Imaging is concerning for potential underlying MAI.  Plan: We will get 3 sputum samples today for respiratory cultures and AFB. Being upon the results of this we could send her over to infectious disease to discuss treatment since she is pretty symptomatic. Will get her nebulizer with albuterol solution and flutter valve for pulmonary toileting. We will see her back in approximately 2 months.  After the cultures have finalized. If  cultures are negative we will consider bronchoscopy with BAL.  We talked about this today in the office. Patient is agreeable.   Current Outpatient Medications:    HYDROmorphone (DILAUDID) 2 MG tablet, Take 1 tablet (2 mg total) by mouth every 4 (four) hours as needed for severe pain. (Patient not taking: Reported on 04/24/2022), Disp: 20 tablet, Rfl: 0   simvastatin (ZOCOR) 20 MG tablet, Take 20 mg by mouth daily., Disp: , Rfl:     Garner Nash, DO [facility] 04/24/2022 11:00 AM

## 2022-04-24 NOTE — Addendum Note (Signed)
Addended by: Alvin Critchley on: 04/24/2022 11:34 AM   Modules accepted: Orders

## 2022-04-24 NOTE — Addendum Note (Signed)
Addended by: Alvin Critchley on: 04/24/2022 12:04 PM   Modules accepted: Orders

## 2022-04-24 NOTE — Patient Instructions (Addendum)
Thank you for visiting Dr. Valeta Harms at [facility]. Today we recommend the following:  3 sputum sample cups, send for resp cultures and AFB X3  Flutter valve Albuterol nebulizer machine   Return in about 2 months (around 06/23/2022) for with APP or Dr. Valeta Harms.    Please do your part to reduce the spread of COVID-19.

## 2022-04-27 ENCOUNTER — Other Ambulatory Visit: Payer: Medicare Other

## 2022-04-27 DIAGNOSIS — J479 Bronchiectasis, uncomplicated: Secondary | ICD-10-CM | POA: Diagnosis not present

## 2022-05-01 DIAGNOSIS — M545 Low back pain, unspecified: Secondary | ICD-10-CM | POA: Diagnosis not present

## 2022-05-01 DIAGNOSIS — G8929 Other chronic pain: Secondary | ICD-10-CM | POA: Diagnosis not present

## 2022-05-05 DIAGNOSIS — J479 Bronchiectasis, uncomplicated: Secondary | ICD-10-CM | POA: Diagnosis not present

## 2022-05-14 ENCOUNTER — Other Ambulatory Visit: Payer: Medicare Other

## 2022-05-14 DIAGNOSIS — J479 Bronchiectasis, uncomplicated: Secondary | ICD-10-CM | POA: Diagnosis not present

## 2022-05-17 LAB — RESPIRATORY CULTURE OR RESPIRATORY AND SPUTUM CULTURE
MICRO NUMBER:: 14662429
RESULT:: NORMAL
SPECIMEN QUALITY:: ADEQUATE

## 2022-05-17 NOTE — Progress Notes (Signed)
Normal flora from sputum cultures. No change in treatment plan at this time  Thanks,  Itta Bena, DO [facility] 05/17/2022 7:49 PM

## 2022-05-25 DIAGNOSIS — Z1231 Encounter for screening mammogram for malignant neoplasm of breast: Secondary | ICD-10-CM | POA: Diagnosis not present

## 2022-05-28 DIAGNOSIS — R928 Other abnormal and inconclusive findings on diagnostic imaging of breast: Secondary | ICD-10-CM | POA: Diagnosis not present

## 2022-05-28 DIAGNOSIS — J479 Bronchiectasis, uncomplicated: Secondary | ICD-10-CM | POA: Diagnosis not present

## 2022-06-04 DIAGNOSIS — J479 Bronchiectasis, uncomplicated: Secondary | ICD-10-CM | POA: Diagnosis not present

## 2022-06-16 DIAGNOSIS — S32010A Wedge compression fracture of first lumbar vertebra, initial encounter for closed fracture: Secondary | ICD-10-CM | POA: Diagnosis not present

## 2022-06-18 DIAGNOSIS — H401131 Primary open-angle glaucoma, bilateral, mild stage: Secondary | ICD-10-CM | POA: Diagnosis not present

## 2022-06-23 ENCOUNTER — Ambulatory Visit: Payer: Medicare Other | Admitting: Nurse Practitioner

## 2022-06-23 ENCOUNTER — Encounter: Payer: Self-pay | Admitting: Nurse Practitioner

## 2022-06-23 VITALS — BP 124/70 | HR 70 | Temp 98.3°F | Ht 65.0 in | Wt 133.4 lb

## 2022-06-23 DIAGNOSIS — J479 Bronchiectasis, uncomplicated: Secondary | ICD-10-CM | POA: Diagnosis not present

## 2022-06-23 DIAGNOSIS — Z87891 Personal history of nicotine dependence: Secondary | ICD-10-CM

## 2022-06-23 MED ORDER — STIOLTO RESPIMAT 2.5-2.5 MCG/ACT IN AERS: 2.0000 | INHALATION_SPRAY | Freq: Every day | RESPIRATORY_TRACT | 5 refills | Status: DC

## 2022-06-23 MED ORDER — STIOLTO RESPIMAT 2.5-2.5 MCG/ACT IN AERS: 2.0000 | INHALATION_SPRAY | Freq: Every day | RESPIRATORY_TRACT | 0 refills | Status: DC

## 2022-06-23 NOTE — Patient Instructions (Signed)
-  Continue Albuterol inhaler 2 puffs or 3 mL neb every 6 hours as needed for shortness of breath, wheezing, increased cough or chest congestion. Notify if symptoms persist despite rescue inhaler/neb use. You do not have to do this four times a day, every day. Follow neb treatment with flutter valve -Trial stiolto 2 puffs daily  We will get you scheduled for pulmonary function testing at your next visit  I am going to send you with another sputum cup in case we don't get the results back on your previous one.   Follow up in 6 weeks with Dr. Tonia Brooms or Florentina Addison Collin Rengel,NP following PFTs. If symptoms worsen, please contact office for sooner follow up or seek emergency care.

## 2022-06-23 NOTE — Progress Notes (Signed)
  ID: Amber Norris, female    DOB: [birth year], 77 y.o.   MRN: [id number]  No chief complaint on file.   Referring provider: No ref. provider found  HPI: 77 year old female, former smoker followed for bronchiectasis. She is a patient of Dr. Myrlene Broker and last seen in office 04/24/2022. Past medical history significant for HLD.  TEST/EVENTS:  03/24/2022 CT chest wo contrast: atherosclerosis. Scattered, small mediastinal lymph nodes. Biapical scar is again seen. Some bronchiectatic change and peribronchial thickening in the lingula. More dense airspace opacities in lingula and RML, markedly improved. Dependent atelectasis. Biapical scar. Compression fx at T7, T12, L1, unchanged.  04/24/2022: OV with Dr. Tonia Brooms for initial pulmonary consult. Referred for abnormal CT imaging - bronchiectatic changes and peribronchial thickening within the lingular and some scattered airspace disease within the RML. Concerning for underlying MAI. Daily productive cough. Lost approx 5 lb. Believes her sisters had similar disease. Obtain sputum culture and AFB. Provided with albuterol neb and flutter valve for pulmonary toileting.   06/23/2022: Today - follow up Patient presents today for 8 week follow up. Her sputum culture was negative with only normal flora present. Her AFB has yet to result (will f/u with lab on this). She does tell me that she had some difficulty/confusion with collecting her sputum cultures. She had brought them back and was told they wouldn't be adequate. She recollected and felt she was successful the second time around. The AFB was collected on 2/19 per the lab information.  Regarding her symptoms, she tells me that she never thought she was short of breath but since she started using the albuterol nebs, she has felt like her breathing is better and her chest is more clear. She still has a daily productive cough with thick, cream sputum, which is normal for her. She does not notice any wheezing  ever. Fatigue feels somewhat better. No further weight loss. She actually gained 4 pounds since she was here last. She denies fevers, chills, hemoptysis. Appetite is normal. She is using the albuterol four times a day (thought she was supposed to use it every day, regardless of symptoms). It is really hard for her to leave the house or do anything with this scheduling. Wants to know if there's another option. She does have a history of long term heavy smoking. Never had PFTs.  Allergies  Allergen Reactions   Codeine     Immunization History  Administered Date(s) Administered   Tdap 06/19/2021    Past Medical History:  Diagnosis Date   Hyperlipemia     Tobacco History: Social History   Tobacco Use  Smoking Status Former   Packs/day: 1.50   Years: 30.00   Additional pack years: 0.00   Total pack years: 45.00   Types: Cigarettes   Quit date: 2009   Years since quitting: 15.3  Smokeless Tobacco Never   Counseling given: Not Answered   Outpatient Medications Prior to Visit  Medication Sig Dispense Refill   albuterol (PROVENTIL) (2.5 MG/3ML) 0.083% nebulizer solution Take 3 mLs (2.5 mg total) by nebulization every 6 (six) hours as needed for wheezing or shortness of breath. 75 mL 12   simvastatin (ZOCOR) 20 MG tablet Take 20 mg by mouth daily.     HYDROmorphone (DILAUDID) 2 MG tablet Take 1 tablet (2 mg total) by mouth every 4 (four) hours as needed for severe pain. (Patient not taking: Reported on 04/24/2022) 20 tablet 0   No facility-administered medications prior to visit.  Review of Systems:   Constitutional: No weight loss or gain, night sweats, fevers, chills, or lassitude. +fatigue (improved) HEENT: No headaches, difficulty swallowing, tooth/dental problems, or sore throat. No sneezing, itching, ear ache, nasal congestion, or post nasal drip CV:  No chest pain, orthopnea, PND, swelling in lower extremities, anasarca, dizziness, palpitations, syncope Resp: +daily  productive cough; shortness of breath with exertion (improved). No chest congestion.. No hemoptysis. No wheezing.  No chest wall deformity GI:  No heartburn, indigestion, abdominal pain, nausea, vomiting, diarrhea, change in bowel habits, loss of appetite, bloody stools.  GU: No dysuria, change in color of urine, urgency or frequency Skin: No rash, lesions, ulcerations MSK:  No joint pain or swelling.   Neuro: No dizziness or lightheadedness.  Psych: No depression or anxiety. Mood stable.     Physical Exam:  BP 124/70 (BP Location: Right Arm, Patient Position: Sitting, Cuff Size: Normal)   Pulse 70   Temp 98.3 F (36.8 C) (Oral)   Ht  (1.651 m)   Wt 133 lb 6.4 oz (60.5 kg)   SpO2 98%   BMI 22.20 kg/m   GEN: Pleasant, interactive, well-appearing; in no acute distress HEENT:  Normocephalic and atraumatic. PERRLA. Sclera white. Nasal turbinates pink, moist and patent bilaterally. No rhinorrhea present. Oropharynx pink and moist, without exudate or edema. No lesions, ulcerations, or postnasal drip.  NECK:  Supple w/ fair ROM. No JVD present. Normal carotid impulses w/o bruits. Thyroid symmetrical with no goiter or nodules palpated. No lymphadenopathy.   CV: RRR, no m/r/g, no peripheral edema. Pulses intact, +2 bilaterally. No cyanosis, pallor or clubbing. PULMONARY:  Unlabored, regular breathing. Clear bilaterally A&P w/o wheezes/rales/rhonchi. No accessory muscle use.  GI: BS present and normoactive. Soft, non-tender to palpation. No organomegaly or masses detected.  MSK: No erythema, warmth or tenderness. Cap refil <2 sec all extrem. No deformities or joint swelling noted.  Neuro: A/Ox3. No focal deficits noted.   Skin: Warm, no lesions or rashe Psych: Normal affect and behavior. Judgement and thought content appropriate.     Lab Results:  CBC No results found for: "WBC", "RBC", "HGB", "HCT", "PLT", "MCV", "MCH", "MCHC", "RDW", "LYMPHSABS", "MONOABS", "EOSABS",  "BASOSABS"  BMET No results found for: "NA", "K", "CL", "CO2", "GLUCOSE", "BUN", "CREATININE", "CALCIUM", "GFRNONAA", "GFRAA"  BNP No results found for: "BNP"   Imaging:  No results found.        No data to display          No results found for: "NITRICOXIDE"      Assessment & Plan:   Bronchiectasis without complication CT imaging concerning for MAI. She has daily, productive cough with white/cream phlegm. No acute exacerbation. AFB culture is still pending for some reason. Will f/u on this today. May recollect. If positive, will refer to ID. If negative, will continue to monitor her symptoms and may need to consider bronchoscopy/BAL in the future.  She has had improvement with frequent SABA use but this is unrealistic to continue using four times a day. Discussed to transition to PRN use. Possible underlying obstructive lung disease related to btx and/or smoking related process. We will schedule her for PFTs. Trial on longer acting bronchodilator regimen with Stiolto. Side effect profile reviewed. Teachback performed. She will continue mucociliary clearance therapies.   Patient Instructions  -Continue Albuterol inhaler 2 puffs or 3 mL neb every 6 hours as needed for shortness of breath, wheezing, increased cough or chest congestion. Notify if symptoms persist despite rescue inhaler/neb use. You  do not have to do this four times a day, every day. Follow neb treatment with flutter valve -Trial stiolto 2 puffs daily  We will get you scheduled for pulmonary function testing at your next visit  I am going to send you with another sputum cup in case we don't get the results back on your previous one.   Follow up in 6 weeks with Dr. Tonia Brooms or Florentina Addison Dayani Winbush,NP following PFTs. If symptoms worsen, please contact office for sooner follow up or seek emergency care.    Former smoker Former heavy smoker. Quit in 2009 with 45 pack year history. See above plan. No suspicious nodules on  recent imaging.    I spent 35 minutes of dedicated to the care of this patient on the date of this encounter to include pre-visit review of records, face-to-face time with the patient discussing conditions above, post visit ordering of testing, clinical documentation with the electronic health record, making appropriate referrals as documented, and communicating necessary findings to members of the patients care team.  Noemi Chapel, NP 06/29/2022  Pt aware and understands NP's role.

## 2022-06-23 NOTE — Assessment & Plan Note (Signed)
Former heavy smoker. Quit in 2009 with 45 pack year history. See above plan. No suspicious nodules on recent imaging.

## 2022-06-23 NOTE — Assessment & Plan Note (Signed)
CT imaging concerning for MAI. She has daily, productive cough with white/cream phlegm. No acute exacerbation. AFB culture is still pending for some reason. Will f/u on this today. May recollect. If positive, will refer to ID. If negative, will continue to monitor her symptoms and may need to consider bronchoscopy/BAL in the future.  She has had improvement with frequent SABA use but this is unrealistic to continue using four times a day. Discussed to transition to PRN use. Possible underlying obstructive lung disease related to btx and/or smoking related process. We will schedule her for PFTs. Trial on longer acting bronchodilator regimen with Stiolto. Side effect profile reviewed. Teachback performed. She will continue mucociliary clearance therapies.   Patient Instructions  -Continue Albuterol inhaler 2 puffs or 3 mL neb every 6 hours as needed for shortness of breath, wheezing, increased cough or chest congestion. Notify if symptoms persist despite rescue inhaler/neb use. You do not have to do this four times a day, every day. Follow neb treatment with flutter valve -Trial stiolto 2 puffs daily  We will get you scheduled for pulmonary function testing at your next visit  I am going to send you with another sputum cup in case we don't get the results back on your previous one.   Follow up in 6 weeks with Dr. Tonia Brooms or Florentina Addison Shelli Portilla,NP following PFTs. If symptoms worsen, please contact office for sooner follow up or seek emergency care.

## 2022-06-26 LAB — AFB CULTURE WITH SMEAR (NOT AT ARMC)
Acid Fast Culture: NEGATIVE
Acid Fast Smear: NEGATIVE

## 2022-06-30 DIAGNOSIS — J479 Bronchiectasis, uncomplicated: Secondary | ICD-10-CM | POA: Diagnosis not present

## 2022-07-05 DIAGNOSIS — J479 Bronchiectasis, uncomplicated: Secondary | ICD-10-CM | POA: Diagnosis not present

## 2022-08-04 DIAGNOSIS — J479 Bronchiectasis, uncomplicated: Secondary | ICD-10-CM | POA: Diagnosis not present

## 2022-08-10 ENCOUNTER — Ambulatory Visit (INDEPENDENT_AMBULATORY_CARE_PROVIDER_SITE_OTHER): Payer: Medicare Other | Admitting: Pulmonary Disease

## 2022-08-10 ENCOUNTER — Encounter: Payer: Self-pay | Admitting: Nurse Practitioner

## 2022-08-10 ENCOUNTER — Encounter (HOSPITAL_BASED_OUTPATIENT_CLINIC_OR_DEPARTMENT_OTHER): Payer: Medicare Other

## 2022-08-10 ENCOUNTER — Ambulatory Visit: Payer: Medicare Other | Admitting: Nurse Practitioner

## 2022-08-10 VITALS — BP 130/78 | HR 73 | Temp 98.4°F | Ht 64.0 in | Wt 133.0 lb

## 2022-08-10 DIAGNOSIS — J479 Bronchiectasis, uncomplicated: Secondary | ICD-10-CM

## 2022-08-10 DIAGNOSIS — A310 Pulmonary mycobacterial infection: Secondary | ICD-10-CM | POA: Diagnosis not present

## 2022-08-10 DIAGNOSIS — J189 Pneumonia, unspecified organism: Secondary | ICD-10-CM

## 2022-08-10 DIAGNOSIS — Z87891 Personal history of nicotine dependence: Secondary | ICD-10-CM | POA: Diagnosis not present

## 2022-08-10 DIAGNOSIS — Z23 Encounter for immunization: Secondary | ICD-10-CM | POA: Diagnosis not present

## 2022-08-10 LAB — PULMONARY FUNCTION TEST
DL/VA % pred: 82 %
DL/VA: 3.39 ml/min/mmHg/L
DLCO cor % pred: 77 %
DLCO cor: 14.76 ml/min/mmHg
DLCO unc % pred: 77 %
DLCO unc: 14.76 ml/min/mmHg
FEF 25-75 Post: 2.8 L/sec
FEF 25-75 Pre: 2.35 L/sec
FEF2575-%Change-Post: 19 %
FEF2575-%Pred-Post: 179 %
FEF2575-%Pred-Pre: 150 %
FEV1-%Change-Post: 3 %
FEV1-%Pred-Post: 112 %
FEV1-%Pred-Pre: 109 %
FEV1-Post: 2.3 L
FEV1-Pre: 2.23 L
FEV1FVC-%Change-Post: 2 %
FEV1FVC-%Pred-Pre: 109 %
FEV6-%Change-Post: 0 %
FEV6-%Pred-Post: 106 %
FEV6-%Pred-Pre: 105 %
FEV6-Post: 2.77 L
FEV6-Pre: 2.74 L
FEV6FVC-%Change-Post: 0 %
FEV6FVC-%Pred-Post: 105 %
FEV6FVC-%Pred-Pre: 105 %
FVC-%Change-Post: 1 %
FVC-%Pred-Post: 101 %
FVC-%Pred-Pre: 100 %
FVC-Post: 2.77 L
FVC-Pre: 2.74 L
Post FEV1/FVC ratio: 83 %
Post FEV6/FVC ratio: 100 %
Pre FEV1/FVC ratio: 81 %
Pre FEV6/FVC Ratio: 100 %
RV % pred: 109 %
RV: 2.55 L
TLC % pred: 106 %
TLC: 5.38 L

## 2022-08-10 MED ORDER — STIOLTO RESPIMAT 2.5-2.5 MCG/ACT IN AERS: 2.0000 | INHALATION_SPRAY | Freq: Every day | RESPIRATORY_TRACT | 0 refills | Status: DC

## 2022-08-10 MED ORDER — STIOLTO RESPIMAT 2.5-2.5 MCG/ACT IN AERS: 2.0000 | INHALATION_SPRAY | Freq: Every day | RESPIRATORY_TRACT | 5 refills | Status: DC

## 2022-08-10 NOTE — Progress Notes (Unsigned)
@Patient  ID: Amber Norris, female    DOB: [birth year], 77 y.o.   MRN: [id number]  Chief Complaint  Patient presents with   Follow-up    Discuss PFT today.  Albuterol causing cramps in lower extremities and hands.    Referring provider: No ref. provider found  HPI: 77 year old female, former smoker followed for bronchiectasis. She is a patient of Dr. Myrlene Broker and last seen in office 06/23/2022 by [facility] NP. Past medical history significant for HLD.  TEST/EVENTS:  03/24/2022 CT chest wo contrast: atherosclerosis. Scattered, small mediastinal lymph nodes. Biapical scar is again seen. Some bronchiectatic change and peribronchial thickening in the lingula. More dense airspace opacities in lingula and RML, markedly improved. Dependent atelectasis. Biapical scar. Compression fx at T7, T12, L1, unchanged. 08/10/2022 PFT: FVC 100, FEV1 109, ratio 83, TLC 106, DLCOcor 77. No BD  04/24/2022: OV with Dr. Tonia Brooms for initial pulmonary consult. Referred for abnormal CT imaging - bronchiectatic changes and peribronchial thickening within the lingular and some scattered airspace disease within the RML. Concerning for underlying MAI. Daily productive cough. Lost approx 5 lb. Believes her sisters had similar disease. Obtain sputum culture and AFB. Provided with albuterol neb and flutter valve for pulmonary toileting.   06/23/2022: OV with Suzi Hernan NP for 8 week follow up. Her sputum culture was negative with only normal flora present. Her AFB has yet to result (will f/u with lab on this). She does tell me that she had some difficulty/confusion with collecting her sputum cultures. She had brought them back and was told they wouldn't be adequate. She recollected and felt she was successful the second time around. The AFB was collected on 2/19 per the lab information.  Regarding her symptoms, she tells me that she never thought she was short of breath but since she started using the albuterol nebs, she has felt like her  breathing is better and her chest is more clear. She still has a daily productive cough with thick, cream sputum, which is normal for her. She does not notice any wheezing ever. Fatigue feels somewhat better. No further weight loss. She actually gained 4 pounds since she was here last. She denies fevers, chills, hemoptysis. Appetite is normal. She is using the albuterol four times a day (thought she was supposed to use it every day, regardless of symptoms). It is really hard for her to leave the house or do anything with this scheduling. Wants to know if there's another option. She does have a history of long term heavy smoking. Never had PFTs.  08/10/2022: Today - follow up Patient presents today for follow up after undergoing pulmonary function testing, which showed normal lung function and volumes. She had a mildly moderate diffusing defect. No reversibility. She feels well today. Tells me that she has been able to put some weight back on. She tried the [employer] after our last visit. She did feel like this cleared up her chest and breathing was a bit easier. She did not receive a refill of this from her pharmacy though so she has stopped using it and gone back to the albuterol four times a day. She feels like this makes her jittery and occasionally makes her hands and feet cramp. She didn't have this problem with the Stiolto. Cough is normal for her. She will produce some white sputum usually once a day but feels like this has actually cleared up some with the breathing treatments. Denies any fevers, chills, hemoptysis, anorexia, night sweats.  Allergies  Allergen Reactions   Codeine     Immunization History  Administered Date(s) Administered   Tdap 06/19/2021    Past Medical History:  Diagnosis Date   Hyperlipemia     Tobacco History: Social History   Tobacco Use  Smoking Status Former   Packs/day: 1.50   Years: 30.00   Additional pack years: 0.00   Total pack years: 45.00   Types:  Cigarettes   Quit date: 2009   Years since quitting: 15.4  Smokeless Tobacco Never   Counseling given: Not Answered   Outpatient Medications Prior to Visit  Medication Sig Dispense Refill   albuterol (PROVENTIL) (2.5 MG/3ML) 0.083% nebulizer solution Take 3 mLs (2.5 mg total) by nebulization every 6 (six) hours as needed for wheezing or shortness of breath. 75 mL 12   simvastatin (ZOCOR) 20 MG tablet Take 20 mg by mouth daily.     HYDROmorphone (DILAUDID) 2 MG tablet Take 1 tablet (2 mg total) by mouth every 4 (four) hours as needed for severe pain. (Patient not taking: Reported on 04/24/2022) 20 tablet 0   Tiotropium Bromide-Olodaterol (STIOLTO RESPIMAT) 2.5-2.5 MCG/ACT AERS Inhale 2 puffs into the lungs daily. (Patient not taking: Reported on 08/10/2022) 4 g 5   Tiotropium Bromide-Olodaterol (STIOLTO RESPIMAT) 2.5-2.5 MCG/ACT AERS Inhale 2 puffs into the lungs daily. (Patient not taking: Reported on 08/10/2022) 4 g 0   No facility-administered medications prior to visit.     Review of Systems:   Constitutional: No weight loss or gain, night sweats, fevers, chills, fatigue or lassitude.  HEENT: No headaches, difficulty swallowing, tooth/dental problems, or sore throat. No sneezing, itching, ear ache, nasal congestion, or post nasal drip CV:  No chest pain, orthopnea, PND, swelling in lower extremities, anasarca, dizziness, palpitations, syncope Resp: +daily productive cough; shortness of breath with exertion (improved). No chest congestion.. No hemoptysis. No wheezing.  No chest wall deformity GI:  No heartburn, indigestion, abdominal pain, nausea, vomiting, diarrhea, change in bowel habits, loss of appetite, bloody stools.  GU: No dysuria, change in color of urine, urgency or frequency Skin: No rash, lesions, ulcerations MSK:  No joint pain or swelling.   Neuro: No dizziness or lightheadedness.  Psych: No depression or anxiety. Mood stable.     Physical Exam:  There were no vitals  taken for this visit.  GEN: Pleasant, interactive, well-appearing; in no acute distress HEENT:  Normocephalic and atraumatic. PERRLA. Sclera white. Nasal turbinates pink, moist and patent bilaterally. No rhinorrhea present. Oropharynx pink and moist, without exudate or edema. No lesions, ulcerations, or postnasal drip.  NECK:  Supple w/ fair ROM. No JVD present. Normal carotid impulses w/o bruits. Thyroid symmetrical with no goiter or nodules palpated. No lymphadenopathy.   CV: RRR, no m/r/g, no peripheral edema. Pulses intact, +2 bilaterally. No cyanosis, pallor or clubbing. PULMONARY:  Unlabored, regular breathing. Clear bilaterally A&P w/o wheezes/rales/rhonchi. No accessory muscle use.  GI: BS present and normoactive. Soft, non-tender to palpation. No organomegaly or masses detected.  MSK: No erythema, warmth or tenderness. Cap refil <2 sec all extrem. No deformities or joint swelling noted.  Neuro: A/Ox3. No focal deficits noted.   Skin: Warm, no lesions or rashe Psych: Normal affect and behavior. Judgement and thought content appropriate.     Lab Results:  CBC No results found for: "WBC", "RBC", "HGB", "HCT", "PLT", "MCV", "MCH", "MCHC", "RDW", "LYMPHSABS", "MONOABS", "EOSABS", "BASOSABS"  BMET No results found for: "NA", "K", "CL", "CO2", "GLUCOSE", "BUN", "CREATININE", "CALCIUM", "GFRNONAA", "GFRAA"  BNP  No results found for: "BNP"   Imaging:  No results found.       Latest Ref Rng & Units 08/10/2022    9:43 AM  PFT Results  FVC-Pre L 2.74  P  FVC-Predicted Pre % 100  P  FVC-Post L 2.77  P  FVC-Predicted Post % 101  P  Pre FEV1/FVC % % 81  P  Post FEV1/FCV % % 83  P  FEV1-Pre L 2.23  P  FEV1-Predicted Pre % 109  P  FEV1-Post L 2.30  P  DLCO uncorrected ml/min/mmHg 14.76  P  DLCO UNC% % 77  P  DLCO corrected ml/min/mmHg 14.76  P  DLCO COR %Predicted % 77  P  DLVA Predicted % 82  P  TLC L 5.38  P  TLC % Predicted % 106  P  RV % Predicted % 109  P    P  Preliminary result    No results found for: "NITRICOXIDE"      Assessment & Plan:   Bronchiectasis without complication (HCC) CT imaging concerning for MAI. She has daily, productive cough with white/cream phlegm. No acute exacerbation. Previous sputum gram stain and AFB both negative. She has had improvement with daily use of bronchodilators. We will restart her on LABA/LAMA and continue to monitor her symptoms. Continue mucociliary clearance therapies. Plan to repeat CT chest to ensure airspace disease in RML/mediastinal nodes have resolved or are stable. Significant improvement on previous imaging from January 2024. May need to consider bronchoscopy/BAL in the future if infectious symptoms were to return/worsen. Updated pna vaccine today.   Patient Instructions  -Continue Albuterol inhaler 2 puffs or 3 mL neb every 6 hours as needed for shortness of breath, wheezing, increased cough or chest congestion. Notify if symptoms persist despite rescue inhaler/neb use. You can just use this as needed. Follow neb treatment with flutter valve -Restart stiolto 2 puffs daily  We will plan to continue to monitor your cough. I'm glad it is doing okay and you've been able to put some weight back on. Let us know if you develop worsening cough, shortness of breath, chest congestion, fevers/chills, any bloody sputum, weight loss, fatigue, night sweats or decreased appetites so we can recollect sputum cultures or consider bronchoscopy.   CT chest in July for 6 month follow up - someone will contact you to schedule this  Updated pneumonia vaccine today    Follow up in 8 weeks with Dr. Tonia Brooms (1st) or Katie Fotios Amos,NP following CT chest. If symptoms worsen, please contact office for sooner follow up or seek emergency care.    Former smoker No formal obstruction. See above.    I spent 35 minutes of dedicated to the care of this patient on the date of this encounter to include pre-visit review of records,  face-to-face time with the patient discussing conditions above, post visit ordering of testing, clinical documentation with the electronic health record, making appropriate referrals as documented, and communicating necessary findings to members of the patients care team.  Noemi Chapel, NP 08/10/2022  Pt aware and understands NP's role.

## 2022-08-10 NOTE — Progress Notes (Signed)
Full PFT completed today ? ?

## 2022-08-10 NOTE — Assessment & Plan Note (Signed)
No formal obstruction. See above.

## 2022-08-10 NOTE — Patient Instructions (Addendum)
-  Continue Albuterol inhaler 2 puffs or 3 mL neb every 6 hours as needed for shortness of breath, wheezing, increased cough or chest congestion. Notify if symptoms persist despite rescue inhaler/neb use. You can just use this as needed. Follow neb treatment with flutter valve -Restart stiolto 2 puffs daily  We will plan to continue to monitor your cough. I'm glad it is doing okay and you've been able to put some weight back on. Let us know if you develop worsening cough, shortness of breath, chest congestion, fevers/chills, any bloody sputum, weight loss, fatigue, night sweats or decreased appetites so we can recollect sputum cultures or consider bronchoscopy.   CT chest in July for 6 month follow up - someone will contact you to schedule this  Updated pneumonia vaccine today    Follow up in 8 weeks with Dr. Tonia Brooms (1st) or Katie Majestic Brister,NP following CT chest. If symptoms worsen, please contact office for sooner follow up or seek emergency care.

## 2022-08-10 NOTE — Assessment & Plan Note (Addendum)
CT imaging concerning for MAI. She has daily, productive cough with white/cream phlegm. No acute exacerbation. Previous sputum gram stain and AFB both negative. She has had improvement with daily use of bronchodilators. We will restart her on LABA/LAMA and continue to monitor her symptoms. Continue mucociliary clearance therapies. Plan to repeat CT chest to ensure airspace disease in RML/mediastinal nodes have resolved or are stable. Significant improvement on previous imaging from January 2024. May need to consider bronchoscopy/BAL in the future if infectious symptoms were to return/worsen. Updated pna vaccine today.   Patient Instructions  -Continue Albuterol inhaler 2 puffs or 3 mL neb every 6 hours as needed for shortness of breath, wheezing, increased cough or chest congestion. Notify if symptoms persist despite rescue inhaler/neb use. You can just use this as needed. Follow neb treatment with flutter valve -Restart stiolto 2 puffs daily  We will plan to continue to monitor your cough. I'm glad it is doing okay and you've been able to put some weight back on. Let us know if you develop worsening cough, shortness of breath, chest congestion, fevers/chills, any bloody sputum, weight loss, fatigue, night sweats or decreased appetites so we can recollect sputum cultures or consider bronchoscopy.   CT chest in July for 6 month follow up - someone will contact you to schedule this  Updated pneumonia vaccine today    Follow up in 8 weeks with Dr. Tonia Brooms (1st) or Katie Tatayana Beshears,NP following CT chest. If symptoms worsen, please contact office for sooner follow up or seek emergency care.

## 2022-08-11 ENCOUNTER — Encounter: Payer: Self-pay | Admitting: Nurse Practitioner

## 2022-08-11 NOTE — Addendum Note (Signed)
Addended byMichail Jewels, Yaphet Smethurst M on: 08/11/2022 12:12 PM   Modules accepted: Orders

## 2022-08-25 DIAGNOSIS — J479 Bronchiectasis, uncomplicated: Secondary | ICD-10-CM | POA: Diagnosis not present

## 2022-09-04 DIAGNOSIS — J479 Bronchiectasis, uncomplicated: Secondary | ICD-10-CM | POA: Diagnosis not present

## 2022-09-15 ENCOUNTER — Encounter (HOSPITAL_BASED_OUTPATIENT_CLINIC_OR_DEPARTMENT_OTHER): Payer: Medicare Other

## 2022-09-15 ENCOUNTER — Ambulatory Visit: Payer: Medicare Other

## 2022-09-15 DIAGNOSIS — R918 Other nonspecific abnormal finding of lung field: Secondary | ICD-10-CM | POA: Diagnosis not present

## 2022-09-15 DIAGNOSIS — J479 Bronchiectasis, uncomplicated: Secondary | ICD-10-CM

## 2022-09-15 DIAGNOSIS — I700 Atherosclerosis of aorta: Secondary | ICD-10-CM | POA: Diagnosis not present

## 2022-09-15 DIAGNOSIS — J984 Other disorders of lung: Secondary | ICD-10-CM | POA: Diagnosis not present

## 2022-09-15 DIAGNOSIS — R591 Generalized enlarged lymph nodes: Secondary | ICD-10-CM

## 2022-09-15 DIAGNOSIS — J189 Pneumonia, unspecified organism: Secondary | ICD-10-CM | POA: Diagnosis not present

## 2022-09-15 DIAGNOSIS — A310 Pulmonary mycobacterial infection: Secondary | ICD-10-CM

## 2022-09-21 NOTE — Progress Notes (Unsigned)
Synopsis: Referred in February 2024 for MAI by No ref. provider found  Subjective:   PATIENT ID: Amber Norris GENDER: female DOB: 1945/10/09, MRN: [id number]  No chief complaint on file.   This is a 77 year old female, past medical history of hyperlipidemia referred for abnormal CT imaging.Patient had CT scan of the chest on 03/24/2022 which had some areas of apical scarring, some bronchiectatic changes and peribronchial thickening within the lingula and some scattered airspace disease within the right middle lobe.  These areas were concerning for possible underlying MAI.  The patient has daily sputum production.  She has daily cough.  And has lost approximately 5 pounds.  She believes that her sisters of years ago had the same disease.  She has not herself had recurrent episodes of pneumonia.  OV 09/21/2022 here today for follow-up.  Last seen in the office with concern of bronchiectasis bilateral pulmonary infiltrates, pneumonia and bronchiectasis exacerbation.  She had 3 sputum's that were sent for culture.Patient's AFB results were negative.  Patient was seen in the office in follow-up June with Rhunette Croft, NP.  Previous sputum was all negative for AFB.  Started her on a LAMA/LABA.  Continued recommendation for airway clearance techniques.  Recommended coming to back to see me to consideration for bronchoscopy if symptoms or not improved.  Also to have review of her recent CT scan of the chest.  She had CT complete on 09/15/2022.  This revealed improvement in the consolidation in the lingula.  She has right middle lobe and lower lobe opacities.  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    Past Medical History:  Diagnosis Date   Hyperlipemia      No family history on file.   Past Surgical History:  Procedure Laterality Date   ABDOMINAL HYSTERECTOMY     FOOT SURGERY     SHOULDER SURGERY      Social History   Socioeconomic History   Marital status: Married    Spouse  name: Not on file   Number of children: Not on file   Years of education: Not on file   Highest education level: Not on file  Occupational History   Not on file  Tobacco Use   Smoking status: Former    Current packs/day: 0.00    Average packs/day: 1.5 packs/day for 30.0 years (45.0 ttl pk-yrs)    Types: Cigarettes    Start date: 30    Quit date: 2009    Years since quitting: 15.5   Smokeless tobacco: Never  Vaping Use   Vaping status: Never Used  Substance and Sexual Activity   Alcohol use: Not Currently   Drug use: Never   Sexual activity: Not on file  Other Topics Concern   Not on file  Social History Narrative   Not on file   Social Determinants of Health   Financial Resource Strain: Not on file  Food Insecurity: Not on file  Transportation Needs: Not on file  Physical Activity: Not on file  Stress: Not on file  Social Connections: Unknown (07/17/2021)   Received from [facility]   Social Network    Social Network: Not on file  Intimate Partner Violence: Unknown (06/19/2021)   Received from [facility]   HITS    Physically Hurt: Not on file    Insult or Talk Down To: Not on file    Threaten Physical Harm: Not on file    Scream or Curse: Not on file  Allergies  Allergen Reactions   Codeine      Outpatient Medications Prior to Visit  Medication Sig Dispense Refill   albuterol (PROVENTIL) (2.5 MG/3ML) 0.083% nebulizer solution Take 3 mLs (2.5 mg total) by nebulization every 6 (six) hours as needed for wheezing or shortness of breath. 75 mL 12   HYDROmorphone (DILAUDID) 2 MG tablet Take 1 tablet (2 mg total) by mouth every 4 (four) hours as needed for severe pain. (Patient not taking: Reported on 04/24/2022) 20 tablet 0   simvastatin (ZOCOR) 20 MG tablet Take 20 mg by mouth daily.     Tiotropium Bromide-Olodaterol (STIOLTO RESPIMAT) 2.5-2.5 MCG/ACT AERS Inhale 2 puffs into the lungs daily. 4 g 5   Tiotropium Bromide-Olodaterol (STIOLTO RESPIMAT) 2.5-2.5  MCG/ACT AERS Inhale 2 puffs into the lungs daily. 1 each 0   No facility-administered medications prior to visit.    Review of Systems  Constitutional:  Negative for chills, fever, malaise/fatigue and weight loss.  HENT:  Negative for hearing loss, sore throat and tinnitus.   Eyes:  Negative for blurred vision and double vision.  Respiratory:  Positive for cough, sputum production and shortness of breath. Negative for hemoptysis, wheezing and stridor.   Cardiovascular:  Negative for chest pain, palpitations, orthopnea, leg swelling and PND.  Gastrointestinal:  Negative for abdominal pain, constipation, diarrhea, heartburn, nausea and vomiting.  Genitourinary:  Negative for dysuria, hematuria and urgency.  Musculoskeletal:  Negative for joint pain and myalgias.  Skin:  Negative for itching and rash.  Neurological:  Negative for dizziness, tingling, weakness and headaches.  Endo/Heme/Allergies:  Negative for environmental allergies. Does not bruise/bleed easily.  Psychiatric/Behavioral:  Negative for depression. The patient is not nervous/anxious and does not have insomnia.   All other systems reviewed and are negative.    Objective:  Physical Exam Vitals reviewed.  Constitutional:      General: She is not in acute distress.    Appearance: She is well-developed.     Comments: Thin elderly  HENT:     Head: Normocephalic and atraumatic.  Eyes:     General: No scleral icterus.    Conjunctiva/sclera: Conjunctivae normal.     Pupils: Pupils are equal, round, and reactive to light.  Neck:     Vascular: No JVD.     Trachea: No tracheal deviation.  Cardiovascular:     Rate and Rhythm: Normal rate and regular rhythm.     Heart sounds: Normal heart sounds. No murmur heard. Pulmonary:     Effort: Pulmonary effort is normal. No tachypnea, accessory muscle usage or respiratory distress.     Breath sounds: No stridor. No wheezing, rhonchi or rales.  Abdominal:     General: There is no  distension.     Palpations: Abdomen is soft.     Tenderness: There is no abdominal tenderness.  Musculoskeletal:        General: No tenderness.     Cervical back: Neck supple.  Lymphadenopathy:     Cervical: No cervical adenopathy.  Skin:    General: Skin is warm and dry.     Capillary Refill: Capillary refill takes less than 2 seconds.     Findings: No rash.  Neurological:     Mental Status: She is alert and oriented to person, place, and time.  Psychiatric:        Behavior: Behavior normal.      There were no vitals filed for this visit.    on RA BMI Readings from Last 3 Encounters:  08/10/22 22.83 kg/m  06/23/22 22.20 kg/m  04/24/22 21.60 kg/m   Wt Readings from Last 3 Encounters:  08/10/22 133 lb (60.3 kg)  06/23/22 133 lb 6.4 oz (60.5 kg)  04/24/22 129 lb 12.8 oz (58.9 kg)     CBC No results found for: "WBC", "RBC", "HGB", "HCT", "PLT", "MCV", "MCH", "MCHC", "RDW", "LYMPHSABS", "MONOABS", "EOSABS", "BASOSABS"   Chest Imaging: 03/24/2022 CT chest: Scattered infiltrate within lingula and right middle lobe possible underlying MAI. The patient's images have been independently reviewed by me.    Pulmonary Functions Testing Results:    Latest Ref Rng & Units 08/10/2022    9:43 AM  PFT Results  FVC-Pre L 2.74  P  FVC-Predicted Pre % 100  P  FVC-Post L 2.77  P  FVC-Predicted Post % 101  P  Pre FEV1/FVC % % 81  P  Post FEV1/FCV % % 83  P  FEV1-Pre L 2.23  P  FEV1-Predicted Pre % 109  P  FEV1-Post L 2.30  P  DLCO uncorrected ml/min/mmHg 14.76  P  DLCO UNC% % 77  P  DLCO corrected ml/min/mmHg 14.76  P  DLCO COR %Predicted % 77  P  DLVA Predicted % 82  P  TLC L 5.38  P  TLC % Predicted % 106  P  RV % Predicted % 109  P    P Preliminary result    FeNO:   Pathology:   Echocardiogram:   Heart Catheterization:     Assessment & Plan:   No diagnosis found.   Discussion:  This is a 77 year old female, past medical history of bronchiectasis and  pulmonary infiltrates within the right middle lobe and lingula.  She has had a chronic ongoing cough with daily sputum production and 5 pound weight loss.  Imaging is concerning for potential underlying MAI.  Plan: We will get 3 sputum samples today for respiratory cultures and AFB. Being upon the results of this we could send her over to infectious disease to discuss treatment since she is pretty symptomatic. Will get her nebulizer with albuterol solution and flutter valve for pulmonary toileting. We will see her back in approximately 2 months.  After the cultures have finalized. If cultures are negative we will consider bronchoscopy with BAL.  We talked about this today in the office. Patient is agreeable.   Current Outpatient Medications:    albuterol (PROVENTIL) (2.5 MG/3ML) 0.083% nebulizer solution, Take 3 mLs (2.5 mg total) by nebulization every 6 (six) hours as needed for wheezing or shortness of breath., Disp: 75 mL, Rfl: 12   HYDROmorphone (DILAUDID) 2 MG tablet, Take 1 tablet (2 mg total) by mouth every 4 (four) hours as needed for severe pain. (Patient not taking: Reported on 04/24/2022), Disp: 20 tablet, Rfl: 0   simvastatin (ZOCOR) 20 MG tablet, Take 20 mg by mouth daily., Disp: , Rfl:    Tiotropium Bromide-Olodaterol (STIOLTO RESPIMAT) 2.5-2.5 MCG/ACT AERS, Inhale 2 puffs into the lungs daily., Disp: 4 g, Rfl: 5   Tiotropium Bromide-Olodaterol (STIOLTO RESPIMAT) 2.5-2.5 MCG/ACT AERS, Inhale 2 puffs into the lungs daily., Disp: 1 each, Rfl: 0    Josephine Igo, DO  Pulmonary Critical Care 09/21/2022 6:07 PM

## 2022-09-22 ENCOUNTER — Encounter: Payer: Self-pay | Admitting: Pulmonary Disease

## 2022-09-22 ENCOUNTER — Ambulatory Visit: Payer: Medicare Other | Admitting: Pulmonary Disease

## 2022-09-22 VITALS — BP 130/70 | HR 70 | Ht 65.0 in | Wt 135.6 lb

## 2022-09-22 DIAGNOSIS — J479 Bronchiectasis, uncomplicated: Secondary | ICD-10-CM

## 2022-09-22 DIAGNOSIS — Z87891 Personal history of nicotine dependence: Secondary | ICD-10-CM | POA: Diagnosis not present

## 2022-09-22 DIAGNOSIS — R918 Other nonspecific abnormal finding of lung field: Secondary | ICD-10-CM | POA: Diagnosis not present

## 2022-09-22 NOTE — Addendum Note (Signed)
Addended by: Hedda Slade on: 09/22/2022 10:55 AM   Modules accepted: Orders

## 2022-09-22 NOTE — Patient Instructions (Signed)
Thank you for visiting Dr. Tonia Brooms at [facility]. Today we recommend the following:  Needs a new flutter valve Continue albuterol as needed   Return in about 1 year (around 09/22/2023) for with APP or Dr. Tonia Brooms.    Please do your part to reduce the spread of COVID-19.

## 2022-10-04 DIAGNOSIS — J479 Bronchiectasis, uncomplicated: Secondary | ICD-10-CM | POA: Diagnosis not present

## 2022-10-05 DIAGNOSIS — J479 Bronchiectasis, uncomplicated: Secondary | ICD-10-CM | POA: Diagnosis not present

## 2022-10-19 DIAGNOSIS — H401131 Primary open-angle glaucoma, bilateral, mild stage: Secondary | ICD-10-CM | POA: Diagnosis not present

## 2022-10-22 DIAGNOSIS — J479 Bronchiectasis, uncomplicated: Secondary | ICD-10-CM | POA: Diagnosis not present

## 2022-11-04 DIAGNOSIS — J479 Bronchiectasis, uncomplicated: Secondary | ICD-10-CM | POA: Diagnosis not present

## 2022-12-02 ENCOUNTER — Telehealth: Payer: Self-pay | Admitting: Pulmonary Disease

## 2022-12-02 DIAGNOSIS — J479 Bronchiectasis, uncomplicated: Secondary | ICD-10-CM

## 2022-12-02 NOTE — Telephone Encounter (Signed)
Patient states changing DME companies to [employer]. Needs Nebulizer machine and supplies. Patient phone number is [phone].

## 2022-12-09 NOTE — Telephone Encounter (Signed)
Order has been placed and sent to [facility]

## 2022-12-10 DIAGNOSIS — J479 Bronchiectasis, uncomplicated: Secondary | ICD-10-CM | POA: Diagnosis not present

## 2023-02-14 ENCOUNTER — Other Ambulatory Visit: Payer: Self-pay | Admitting: Nurse Practitioner

## 2023-02-14 DIAGNOSIS — J479 Bronchiectasis, uncomplicated: Secondary | ICD-10-CM

## 2023-02-17 ENCOUNTER — Telehealth: Payer: Self-pay | Admitting: Pulmonary Disease

## 2023-02-17 DIAGNOSIS — J479 Bronchiectasis, uncomplicated: Secondary | ICD-10-CM

## 2023-02-17 DIAGNOSIS — H401131 Primary open-angle glaucoma, bilateral, mild stage: Secondary | ICD-10-CM | POA: Diagnosis not present

## 2023-02-17 NOTE — Telephone Encounter (Signed)
PT states Stiolto is being denied by Pharm. She is out and can not understand why.  Pharm is [facility] in [place].   [phone] is her number

## 2023-02-18 MED ORDER — STIOLTO RESPIMAT 2.5-2.5 MCG/ACT IN AERS: 2.0000 | INHALATION_SPRAY | Freq: Every day | RESPIRATORY_TRACT | 5 refills | Status: DC

## 2023-02-18 NOTE — Telephone Encounter (Signed)
[employer] [facility] and spoke with the pharmacy staff regarding the Stiolto inhaler.  I was told that she was needing a refill and she used an old prescription and eventually it gets denied out. She did verbalize that the patient was in the pharmacy this morning and picked up her Stiolto without any need for a PA.  Nothing further needed.  Stiolto refilled to get her until her next OV due (09/2023).  Called and spoke with patient, advised her that a new prescription has been sent to her pharmacy [facility]), enough to get her to her f/u appointment in July.  She verbalized understanding.  Nothing further needed.

## 2023-02-22 DIAGNOSIS — M810 Age-related osteoporosis without current pathological fracture: Secondary | ICD-10-CM | POA: Diagnosis not present

## 2023-02-22 DIAGNOSIS — S22080S Wedge compression fracture of T11-T12 vertebra, sequela: Secondary | ICD-10-CM | POA: Diagnosis not present

## 2023-02-22 DIAGNOSIS — I700 Atherosclerosis of aorta: Secondary | ICD-10-CM | POA: Diagnosis not present

## 2023-02-22 DIAGNOSIS — K219 Gastro-esophageal reflux disease without esophagitis: Secondary | ICD-10-CM | POA: Diagnosis not present

## 2023-02-22 DIAGNOSIS — J479 Bronchiectasis, uncomplicated: Secondary | ICD-10-CM | POA: Diagnosis not present

## 2023-02-22 DIAGNOSIS — R7303 Prediabetes: Secondary | ICD-10-CM | POA: Diagnosis not present

## 2023-02-22 DIAGNOSIS — K449 Diaphragmatic hernia without obstruction or gangrene: Secondary | ICD-10-CM | POA: Diagnosis not present

## 2023-02-22 DIAGNOSIS — E7800 Pure hypercholesterolemia, unspecified: Secondary | ICD-10-CM | POA: Diagnosis not present

## 2023-02-22 DIAGNOSIS — Z0000 Encounter for general adult medical examination without abnormal findings: Secondary | ICD-10-CM | POA: Diagnosis not present

## 2023-02-22 DIAGNOSIS — S32010S Wedge compression fracture of first lumbar vertebra, sequela: Secondary | ICD-10-CM | POA: Diagnosis not present

## 2023-02-22 DIAGNOSIS — K589 Irritable bowel syndrome without diarrhea: Secondary | ICD-10-CM | POA: Diagnosis not present

## 2023-02-22 DIAGNOSIS — I2510 Atherosclerotic heart disease of native coronary artery without angina pectoris: Secondary | ICD-10-CM | POA: Diagnosis not present

## 2023-02-22 DIAGNOSIS — M150 Primary generalized (osteo)arthritis: Secondary | ICD-10-CM | POA: Diagnosis not present

## 2023-03-02 ENCOUNTER — Other Ambulatory Visit: Payer: Self-pay | Admitting: Pulmonary Disease

## 2023-03-09 DIAGNOSIS — R194 Change in bowel habit: Secondary | ICD-10-CM | POA: Diagnosis not present

## 2023-03-09 DIAGNOSIS — Z1211 Encounter for screening for malignant neoplasm of colon: Secondary | ICD-10-CM | POA: Diagnosis not present

## 2023-03-23 DIAGNOSIS — J479 Bronchiectasis, uncomplicated: Secondary | ICD-10-CM | POA: Diagnosis not present

## 2023-03-24 DIAGNOSIS — R194 Change in bowel habit: Secondary | ICD-10-CM | POA: Diagnosis not present

## 2023-03-24 DIAGNOSIS — K52831 Collagenous colitis: Secondary | ICD-10-CM | POA: Diagnosis not present

## 2023-03-24 DIAGNOSIS — Z860101 Personal history of adenomatous and serrated colon polyps: Secondary | ICD-10-CM | POA: Diagnosis not present

## 2023-03-24 DIAGNOSIS — Z1211 Encounter for screening for malignant neoplasm of colon: Secondary | ICD-10-CM | POA: Diagnosis not present

## 2023-03-24 DIAGNOSIS — K5730 Diverticulosis of large intestine without perforation or abscess without bleeding: Secondary | ICD-10-CM | POA: Diagnosis not present

## 2023-03-24 DIAGNOSIS — Z860100 Personal history of colon polyps, unspecified: Secondary | ICD-10-CM | POA: Diagnosis not present

## 2023-04-20 DIAGNOSIS — L814 Other melanin hyperpigmentation: Secondary | ICD-10-CM | POA: Diagnosis not present

## 2023-04-20 DIAGNOSIS — B078 Other viral warts: Secondary | ICD-10-CM | POA: Diagnosis not present

## 2023-04-20 DIAGNOSIS — L578 Other skin changes due to chronic exposure to nonionizing radiation: Secondary | ICD-10-CM | POA: Diagnosis not present

## 2023-04-20 DIAGNOSIS — L658 Other specified nonscarring hair loss: Secondary | ICD-10-CM | POA: Diagnosis not present

## 2023-04-20 DIAGNOSIS — L821 Other seborrheic keratosis: Secondary | ICD-10-CM | POA: Diagnosis not present

## 2023-04-20 DIAGNOSIS — L570 Actinic keratosis: Secondary | ICD-10-CM | POA: Diagnosis not present

## 2023-05-06 DIAGNOSIS — N390 Urinary tract infection, site not specified: Secondary | ICD-10-CM | POA: Diagnosis not present

## 2023-05-11 ENCOUNTER — Telehealth: Payer: Self-pay | Admitting: Nurse Practitioner

## 2023-05-12 NOTE — Telephone Encounter (Signed)
 Faxed back 05/12/23 confirmation received

## 2023-05-13 DIAGNOSIS — K5730 Diverticulosis of large intestine without perforation or abscess without bleeding: Secondary | ICD-10-CM | POA: Diagnosis not present

## 2023-05-13 DIAGNOSIS — Z860100 Personal history of colon polyps, unspecified: Secondary | ICD-10-CM | POA: Diagnosis not present

## 2023-05-13 DIAGNOSIS — K52831 Collagenous colitis: Secondary | ICD-10-CM | POA: Diagnosis not present

## 2023-05-26 DIAGNOSIS — Z1231 Encounter for screening mammogram for malignant neoplasm of breast: Secondary | ICD-10-CM | POA: Diagnosis not present

## 2023-05-31 ENCOUNTER — Ambulatory Visit (HOSPITAL_BASED_OUTPATIENT_CLINIC_OR_DEPARTMENT_OTHER): Payer: Medicare Other | Admitting: Adult Health

## 2023-05-31 ENCOUNTER — Encounter (HOSPITAL_BASED_OUTPATIENT_CLINIC_OR_DEPARTMENT_OTHER): Payer: Self-pay | Admitting: Adult Health

## 2023-05-31 VITALS — BP 122/80 | HR 91 | Ht 65.0 in | Wt 134.4 lb

## 2023-05-31 DIAGNOSIS — J479 Bronchiectasis, uncomplicated: Secondary | ICD-10-CM

## 2023-05-31 MED ORDER — ALBUTEROL SULFATE HFA 108 (90 BASE) MCG/ACT IN AERS: 2.0000 | INHALATION_SPRAY | Freq: Four times a day (QID) | RESPIRATORY_TRACT | 3 refills | Status: AC | PRN

## 2023-05-31 NOTE — Patient Instructions (Addendum)
 Continue on Stiolto 2 puffs daily Albuterol inhaler /neb As needed   Flutter valve 1-2 times a day.  Activity as tolerated.  Follow up with Dr. Vassie Loll  in 4-6 months and As needed

## 2023-05-31 NOTE — Progress Notes (Unsigned)
 @Patient  ID: Amber Norris, female    DOB: Feb 08, 1946, 78 y.o.   MRN: [id number]  Chief Complaint  Patient presents with   Follow-up   Discussed the use of AI scribe software for clinical note transcription with the patient, who gave verbal consent to proceed.  Referring provider: No ref. provider found  HPI: 78 year old female former smoker followed for bronchiectasis with abnormal CT chest concerning for MAI  TEST/EVENTS :  AFB sputum culture February 2024 negative CT chest December 2023 patchy airspace opacity in the upper lobes bilaterally and right middle lobe, tree-in-bud nodular opacities, moderate to large hiatal hernia  CT chest January 2024 biapical scarring, bronchiectatic changes in the lingula, dense opacities in the lingula and right middle lobe have markedly improved  CT chest July 2024 showed improved consolidation in the lingula, residual scarring in the lingula, right middle lobe and peripherally in the lower lobe lung bases.  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  65/2024 PFT normal , DLCO 77%.   05/31/2023 Follow up : Bronchiectasis  Patient returns for a follow-up visit.  Patient was last seen July 2024.  Patient has underlying bronchiectasis.  CT chest has areas of scarring by apically and bibasilar concerning for possible MAI.  Sputum AFB negative in February 2024.  Discussed the use of AI scribe software for clinical note transcription with the patient, who gave verbal consent to proceed.  History of Present Illness   Amber Norris is a 78 year old female with bronchiectasis who presents for a checkup and to establish care with a new doctor. She was referred by Dr. Doristine Mango to establish care.  She has a history of bronchiectasis and is currently using Stiolto, two puffs once a day, to maintain airway patency and prevent mucus plugging. She utilizes a flutter valve once daily, increasing to twice daily if congestion or bronchitis symptoms  worsen. This morning, she expectorated a significant amount of mucus, which provided relief. She uses albuterol via a nebulizer once daily, increasing to twice daily during exacerbations. Initially, she used it four times daily due to fear after diagnosis but now uses it as needed. She also carries an albuterol inhaler for convenience during travel. No hemoptysis is noted. She experiences hoarseness, attributing it to allergies or environmental factors.  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 She finds it challenging to provide adequate sputum samples for testing due to insufficient mucus production. She is prepared to submit samples if mucus production increases.  She experiences severe nocturnal leg cramps, which have intensified over the past six months. She is on a statin and a diuretic, which may contribute to muscle cramping. She also has a history of colitis, potentially related to her symptoms.  She experiences back pain, described as a sensation of bones rubbing together, particularly with movement. She has a history of compression fractures at T7, T12, and L1, attributed to lifting furniture. This has not been discussed with her family doctor.  She is attempting to remain active, having previously walked up to ten miles daily with a friend, but has been less active during the winter. She is working on resuming her activity level. She has a history of pneumonia and has received the pneumonia vaccine. She does not take the COVID vaccine. She had a CT scan less than a year ago and is not due for imaging at this time.      Allergies  Allergen Reactions  Atorvastatin Other (See Comments)   Codeine     Immunization History  Administered Date(s) Administered   PNEUMOCOCCAL CONJUGATE-20 08/10/2022   Tdap 06/19/2021    Past Medical History:  Diagnosis Date   Hyperlipemia     Tobacco History: Social History    Tobacco Use  Smoking Status Former   Current packs/day: 0.00   Average packs/day: 1.5 packs/day for 30.0 years (45.0 ttl pk-yrs)   Types: Cigarettes   Start date: 41   Quit date: 2009   Years since quitting: 16.2  Smokeless Tobacco Never   Counseling given: Not Answered   Outpatient Medications Prior to Visit  Medication Sig Dispense Refill   albuterol (PROVENTIL) (2.5 MG/3ML) 0.083% nebulizer solution USE 1 VIAL IN NEBULIZER 4 TIMES DAILY - and as needed 90 mL 11   budesonide (ENTOCORT EC) 3 MG 24 hr capsule Take 9 mg by mouth daily.     simvastatin (ZOCOR) 20 MG tablet Take 20 mg by mouth daily.     Tiotropium Bromide-Olodaterol (STIOLTO RESPIMAT) 2.5-2.5 MCG/ACT AERS Inhale 2 puffs into the lungs daily. 4 g 5   No facility-administered medications prior to visit.     Review of Systems:   Constitutional:   No  weight loss, night sweats,  Fevers, chills, fatigue, or  lassitude.  HEENT:   No headaches,  Difficulty swallowing,  Tooth/dental problems, or  Sore throat,                No sneezing, itching, ear ache, nasal congestion, post nasal drip,   CV:  No chest pain,  Orthopnea, PND, swelling in lower extremities, anasarca, dizziness, palpitations, syncope.   GI  No heartburn, indigestion, abdominal pain, nausea, vomiting, diarrhea, change in bowel habits, loss of appetite, bloody stools.   Resp: No shortness of breath with exertion or at rest.  No excess mucus, no productive cough,  No non-productive cough,  No coughing up of blood.  No change in color of mucus.  No wheezing.  No chest wall deformity  Skin: no rash or lesions.  GU: no dysuria, change in color of urine, no urgency or frequency.  No flank pain, no hematuria   MS:  No joint pain or swelling.  No decreased range of motion.  No back pain.    Physical Exam  BP 122/80   Pulse 91   Ht 5\' 5"  (1.651 m)   Wt 134 lb 6.4 oz (61 kg)   SpO2 98%   BMI 22.37 kg/m   GEN: A/Ox3; pleasant , NAD, well  nourished    HEENT:  [place]/AT,  EACs-clear, TMs-wnl, NOSE-clear, THROAT-clear, no lesions, no postnasal drip or exudate noted.   NECK:  Supple w/ fair ROM; no JVD; normal carotid impulses w/o bruits; no thyromegaly or nodules palpated; no lymphadenopathy.    RESP  Clear  P & A; w/o, wheezes/ rales/ or rhonchi. no accessory muscle use, no dullness to percussion  CARD:  RRR, no m/r/g, no peripheral edema, pulses intact, no cyanosis or clubbing.  GI:   Soft & nt; nml bowel sounds; no organomegaly or masses detected.   Musco: Warm bil, no deformities or joint swelling noted.   Neuro: alert, no focal deficits noted.    Skin: Warm, no lesions or rashes    Lab Results:  CBC No results found for: "WBC", "RBC", "HGB", "HCT", "PLT", "MCV", "MCH", "MCHC", "RDW", "LYMPHSABS", "MONOABS", "EOSABS", "BASOSABS"  BMET No results found for: "NA", "K", "CL", "CO2", "GLUCOSE", "BUN", "CREATININE", "CALCIUM", "GFRNONAA", "GFRAA"  BNP No results found for: "BNP"  ProBNP No results found for: "PROBNP"  Imaging: No results found.  Administration History     None          Latest Ref Rng & Units 08/10/2022    9:43 AM  PFT Results  FVC-Pre L 2.74   FVC-Predicted Pre % 100   FVC-Post L 2.77   FVC-Predicted Post % 101   Pre FEV1/FVC % % 81   Post FEV1/FCV % % 83   FEV1-Pre L 2.23   FEV1-Predicted Pre % 109   FEV1-Post L 2.30   DLCO uncorrected ml/min/mmHg 14.76   DLCO UNC% % 77   DLCO corrected ml/min/mmHg 14.76   DLCO COR %Predicted % 77   DLVA Predicted % 82   TLC L 5.38   TLC % Predicted % 106   RV % Predicted % 109     No results found for: "NITRICOXIDE"      Assessment & Plan:   No problem-specific Assessment & Plan notes found for this encounter.     Rubye Oaks, NP 05/31/2023

## 2023-06-22 DIAGNOSIS — Z885 Allergy status to narcotic agent status: Secondary | ICD-10-CM | POA: Diagnosis not present

## 2023-06-22 DIAGNOSIS — M51362 Other intervertebral disc degeneration, lumbar region with discogenic back pain and lower extremity pain: Secondary | ICD-10-CM | POA: Diagnosis not present

## 2023-06-22 DIAGNOSIS — M47816 Spondylosis without myelopathy or radiculopathy, lumbar region: Secondary | ICD-10-CM | POA: Diagnosis not present

## 2023-06-22 DIAGNOSIS — Z87891 Personal history of nicotine dependence: Secondary | ICD-10-CM | POA: Diagnosis not present

## 2023-06-22 DIAGNOSIS — M545 Low back pain, unspecified: Secondary | ICD-10-CM | POA: Diagnosis not present

## 2023-06-22 DIAGNOSIS — M5442 Lumbago with sciatica, left side: Secondary | ICD-10-CM | POA: Diagnosis not present

## 2023-06-22 DIAGNOSIS — M4186 Other forms of scoliosis, lumbar region: Secondary | ICD-10-CM | POA: Diagnosis not present

## 2023-06-22 DIAGNOSIS — M4187 Other forms of scoliosis, lumbosacral region: Secondary | ICD-10-CM | POA: Diagnosis not present

## 2023-06-22 DIAGNOSIS — M47817 Spondylosis without myelopathy or radiculopathy, lumbosacral region: Secondary | ICD-10-CM | POA: Diagnosis not present

## 2023-06-22 DIAGNOSIS — M5432 Sciatica, left side: Secondary | ICD-10-CM | POA: Diagnosis not present

## 2023-06-22 DIAGNOSIS — M47896 Other spondylosis, lumbar region: Secondary | ICD-10-CM | POA: Diagnosis not present

## 2023-07-01 DIAGNOSIS — H401131 Primary open-angle glaucoma, bilateral, mild stage: Secondary | ICD-10-CM | POA: Diagnosis not present

## 2023-07-15 ENCOUNTER — Other Ambulatory Visit: Payer: Self-pay | Admitting: Nurse Practitioner

## 2023-07-15 DIAGNOSIS — J479 Bronchiectasis, uncomplicated: Secondary | ICD-10-CM

## 2023-07-20 NOTE — Telephone Encounter (Unsigned)
 Copied from CRM [id number]. Topic: Clinical - Medication Refill >> Jul 20, 2023 11:21 AM Juliaette Ober wrote: Medication: albuterol  (PROVENTIL )   Has the patient contacted their pharmacy? Yes (Agent: If no, request that the patient contact the pharmacy for the refill. If patient does not wish to contact the pharmacy document the reason why and proceed with request.) (Agent: If yes, when and what did the pharmacy advise?)  This is the patient's preferred pharmacy:  [id number] [place], [place] - [address] [place] [address] Phone: [phone] Fax: [phone]  Is this the correct pharmacy for this prescription? Yes If no, delete pharmacy and type the correct one.   Has the prescription been filled recently? No  Is the patient out of the medication? No  Has the patient been seen for an appointment in the last year OR does the patient have an upcoming appointment? Yes  Can we respond through MyChart? Yes  Agent: Please be advised that Rx refills may take up to 3 business days. We ask that you follow-up with your pharmacy.

## 2023-07-26 ENCOUNTER — Other Ambulatory Visit (HOSPITAL_BASED_OUTPATIENT_CLINIC_OR_DEPARTMENT_OTHER): Payer: Self-pay | Admitting: Pulmonary Disease

## 2023-07-26 ENCOUNTER — Ambulatory Visit: Payer: Self-pay | Admitting: Adult Health

## 2023-07-26 MED ORDER — ALBUTEROL SULFATE (2.5 MG/3ML) 0.083% IN NEBU: 2.5000 mg | INHALATION_SOLUTION | Freq: Four times a day (QID) | RESPIRATORY_TRACT | 11 refills | Status: AC | PRN

## 2023-07-26 NOTE — Telephone Encounter (Signed)
 Spoke with pt about refill of albuterol  nebulizer, pt confirms she just ran out of it and confirms no symptoms or worsening SOB from her usual. Advised call back if symptoms, sending refill request to office. Pt verbalized understanding. Please advise. Per pt chart, action pending on refill    Reason for Disposition  [1] Prescription refill request for ESSENTIAL medicine (i.e., likelihood of harm to patient if not taken) AND [2] triager unable to refill per department policy  Answer Assessment - Initial Assessment Questions 1. DRUG NAME: "What medicine do you need to have refilled?"     Albuterol  nebulizer 2. REFILLS REMAINING: "How many refills are remaining?" (Note: The label on the medicine or pill bottle will show how many refills are remaining. If there are no refills remaining, then a renewal may be needed.)     None left, just ran out of it 4. PRESCRIBING HCP: "Who prescribed it?" Reason: If prescribed by specialist, call should be referred to that group.     pulm 5. SYMPTOMS: "Do you have any symptoms?"     Denies  [facility] would not refill it anymore, they dropped [employer] No symptoms  Protocols used: Medication Refill and Renewal Call-A-AH

## 2023-07-26 NOTE — Telephone Encounter (Signed)
 Med sent to pt preferred pharmacy. Pt is aware. Nothing further needed.

## 2023-07-26 NOTE — Telephone Encounter (Signed)
 Copied from CRM [id number]. Topic: Clinical - Medication Refill >> Jul 26, 2023 11:18 AM Eveleen Hinds B wrote: Medication: Albuterol  (Proventil ) (2.5mg Flora Humphreys) 0.083% nebulizer solution  Has the patient contacted their pharmacy? Yes (Agent: If no, request that the patient contact the pharmacy for the refill. If patient does not wish to contact the pharmacy document the reason why and proceed with request.) (Agent: If yes, when and what did the pharmacy advise?)  This is the patient's preferred pharmacy:   [phone] [place], [place] - [address] [place] [address] Phone: [phone] Fax: [phone]  Is this the correct pharmacy for this prescription? Yes If no, delete pharmacy and type the correct one.   Has the prescription been filled recently? Yes  Is the patient out of the medication? No  Has the patient been seen for an appointment in the last year OR does the patient have an upcoming appointment? Yes  Can we respond through MyChart? No  Agent: Please be advised that Rx refills may take up to 3 business days. We ask that you follow-up with your pharmacy.

## 2023-07-26 NOTE — Addendum Note (Signed)
 Addended byGregory Leash, Jacalynn Buzzell A on: 07/26/2023 04:24 PM   Modules accepted: Orders

## 2023-08-07 DIAGNOSIS — R5383 Other fatigue: Secondary | ICD-10-CM | POA: Diagnosis not present

## 2023-08-07 DIAGNOSIS — R52 Pain, unspecified: Secondary | ICD-10-CM | POA: Diagnosis not present

## 2023-08-07 DIAGNOSIS — R6883 Chills (without fever): Secondary | ICD-10-CM | POA: Diagnosis not present

## 2023-09-01 DIAGNOSIS — S22080S Wedge compression fracture of T11-T12 vertebra, sequela: Secondary | ICD-10-CM | POA: Diagnosis not present

## 2023-09-01 DIAGNOSIS — S32010S Wedge compression fracture of first lumbar vertebra, sequela: Secondary | ICD-10-CM | POA: Diagnosis not present

## 2023-09-01 DIAGNOSIS — G8929 Other chronic pain: Secondary | ICD-10-CM | POA: Diagnosis not present

## 2023-09-01 DIAGNOSIS — M810 Age-related osteoporosis without current pathological fracture: Secondary | ICD-10-CM | POA: Diagnosis not present

## 2023-09-01 DIAGNOSIS — M545 Low back pain, unspecified: Secondary | ICD-10-CM | POA: Diagnosis not present

## 2023-09-06 NOTE — Therapy (Unsigned)
 OUTPATIENT PHYSICAL THERAPY THORACOLUMBAR EVALUATION   Patient Name: Amber Norris MRN: [id number] DOB:1945/08/25, 78 y.o., female Today's Date: 09/07/2023  END OF SESSION:  PT End of Session - 09/07/23 1306     Visit Number 1    Number of Visits 16    Date for PT Re-Evaluation 11/02/23    Authorization Type [employer] medicare $20 copay    Authorization Time Period auth required    PT Start Time 1145    PT Stop Time 1235    PT Time Calculation (min) 50 min    Activity Tolerance Patient tolerated treatment well          Past Medical History:  Diagnosis Date   Hyperlipemia    Past Surgical History:  Procedure Laterality Date   ABDOMINAL HYSTERECTOMY     FOOT SURGERY     SHOULDER SURGERY     Patient Active Problem List   Diagnosis Date Noted   Bronchiectasis without complication (HCC) 06/23/2022   Former smoker 06/23/2022    PCP: Dr Velna JONELLE Skeeter  REFERRING PROVIDER: Dr Velna JONELLE Skeeter  REFERRING DIAG: Low back pain   Rationale for Evaluation and Treatment: Rehabilitation  THERAPY DIAG:  Other low back pain  Muscle weakness (generalized)  Abnormal posture  ONSET DATE: 05/09/23  SUBJECTIVE:                                                                                                                                                                                           SUBJECTIVE STATEMENT: Patient reports that she has had increased LBP since 4/25. She has had some LBP for several months or years on an intermittent basis. She was treated with medication and exercises with no significant improvement.   PERTINENT HISTORY:  Chronic LBP, osteoporosis; osteoarthritis; lung disease; lumbar fractures most recent ~ 2 yrs ago   PAIN:  Are you having pain? Yes: NPRS scale: 4 Pain location: L > R posterior hips and some in the low back  Pain description: dull ache - intermittent  Aggravating factors: sitting > 5-10 min; bending; lifting; reaching; dressing putting  pants on  Relieving factors: standing; heating pad  PRECAUTIONS: Back - osteoporosis   RED FLAGS: None   WEIGHT BEARING RESTRICTIONS: No  FALLS:  Has patient fallen in last 6 months? 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  DIAGNOSTIC FINDINGS:  Xray lumbar spine: 06/22/23: Interpretation: There are mild compression fractures of the T12-L4 vertebral bodies. There is grade 1 retrolisthesis of L1 on L2 and L2 on L3. There is disc space loss that is most severe at L1-2 and L5-S1 and there is lower lumbar facet arthropathy. There is mild right convex curvature.   PATIENT SURVEYS:  Modified Oswestry: 28/50; 56%  COGNITION: Overall cognitive status: Within functional limits for tasks assessed     SENSATION: WFL  MUSCLE LENGTH: Hamstrings: Right 70 deg; Left 70 deg Tight hip flexors Quads tight ~ 90 deg flex prone   POSTURE: rounded shoulders, forward head, increased lumbar lordosis, increased thoracic kyphosis, flexed trunk , and L LE/hemipelvis higher than R  PALPATION: Tender R hip flexors; L > R lumbar paraspinals; QL; lats along the crest of the pelvis   LUMBAR ROM:   AROM eval  Flexion 65% pain L LB  Extension 50% feels good   Right lateral flexion 85% pain R side  Left lateral flexion 90% pull no pain  Right rotation 50%  Left rotation 50%   (Blank rows = not tested)  LOWER EXTREMITY ROM:   tight end ranges bilat hips   Active   Right eval Left eval  Hip flexion    Hip extension    Hip abduction    Hip adduction    Hip internal rotation    Hip external rotation    Knee flexion(prone)  90  90   Knee extension    Ankle dorsiflexion    Ankle plantarflexion    Ankle inversion    Ankle eversion     (Blank rows = not tested)  LOWER EXTREMITY MMT:    MMT Right eval Left eval  Hip flexion 5 5  Hip extension 4 4  Hip abduction 5 4+  Hip adduction    Hip internal rotation    Hip external rotation    Knee flexion    Knee extension    Ankle dorsiflexion    Ankle plantarflexion    Ankle inversion    Ankle eversion     (Blank rows = not tested)  LUMBAR SPECIAL TESTS:  Straight leg raise test: Negative and Slump test: Negative  FUNCTIONAL TESTS:  5 times sit to stand:  SLS R with UE support 10 sec; L 5 sec with LOB     GAIT: Distance walked: 40 feet  Assistive device utilized: None Level of assistance: Complete Independence Comments: fwd flexed posture  TREATMENT DATE: 09/07/23 POC reviewed  HEP      Rolling and coming to sit with core engaged  PATIENT EDUCATION:  Education details: POC; HEP  Person educated: Patient Education method: Programmer, multimedia, Demonstration, Actor cues, Verbal cues, and Handouts Education comprehension: verbalized understanding, returned demonstration, verbal cues required, tactile cues required, and needs further education  HOME EXERCISE PROGRAM: Access Code: [email or web address] URL: [email or web address] Date: 09/07/2023 Prepared by: Obaloluwa Delatte  Program Notes lying on back hips and knees bentbring knees toward chestturn hands toward face push hands over head hold 30 sec bring arms down then bring legs down   Exercises - Supine Transversus Abdominis Bracing with Pelvic Floor Contraction  - 2 x daily - 7 x weekly - 1 sets - 10 reps -  10sec  hold - Prone Quadriceps Stretch with Strap  - 1 x daily - 7 x weekly - 1 sets - 3 reps - 30 sec  hold - Supine Shoulder Flexion Extension AAROM with Dowel  - 1 x daily - 7 x weekly - 1 sets - 10 reps - 2-3 sec  hold  ASSESSMENT:  CLINICAL IMPRESSION: Patient is a 78 y.o. female who was seen today for physical therapy evaluation and treatment for low back pain. She has a history of recurrent LBP with degenerative changes in the lumbar spine. Patient presents today with pain in the L > R LB and posterior hip area. She has poor posture and alignment; limited trunk and LE mobility/ROM/strength; poor core strength for transitional movements and transfers. She has palpable tightness in L > R hip flexors, R lumbar paraspinals; lats/lumbar paraspinals. She has pain with functional activities. Patient will benefit from PT to address problems identified.    OBJECTIVE IMPAIRMENTS: decreased activity tolerance, decreased balance, decreased mobility, decreased ROM, decreased strength, improper body mechanics, postural dysfunction, and pain.   ACTIVITY LIMITATIONS: carrying, lifting, bending, sitting, squatting, and sleeping  PARTICIPATION LIMITATIONS: meal prep, cleaning, community activity, and yard work  PERSONAL FACTORS: Age, Fitness, Past/current experiences, and Time since onset of injury/illness/exacerbation are also affecting patient's functional outcome.   REHAB POTENTIAL: Good  CLINICAL DECISION MAKING: Evolving/moderate complexity  EVALUATION COMPLEXITY: Moderate   GOALS: Goals reviewed with patient? Yes  SHORT TERM GOALS: Target date: 10/05/2023   Independent in initial HEP Baseline: Goal status: INITIAL  2.  Patient will demonstrate and verbalize improved/proper transfers and transitional movements  Baseline:  Goal status: INITIAL   LONG TERM GOALS: Target date: 11/02/2023   Decrease pain by 75-80%  Baseline:  Goal status: INITIAL  2.  Increased core strength and LE  strength to 5/5  Baseline:  Goal status: INITIAL  3.  Patient reports ability to walk 3-7 miles with no increase in pain  Baseline:  Goal status: INITIAL  4.  Patient reports ability to sleep through the night without awakening due to pain  Baseline:  Goal status: INITIAL  5.  Independent in advanced HEP  Baseline:  Goal status: INITIAL  6.  Improve Modified Oswestry Score by 10-15%  Baseline: 28/50; 56%  Goal status: INITIAL  PLAN:  PT FREQUENCY: 2x/week  PT DURATION: 8 weeks  PLANNED INTERVENTIONS: 97164- PT Re-evaluation, 97110-Therapeutic exercises, 97530- Therapeutic activity, 97112- Neuromuscular re-education, 97535- Self Care, [id number]- Manual therapy, [phone]- Aquatic Therapy, Patient/Family education, and Taping  PLAN FOR NEXT SESSION: review and progress exercises; continue with spine care and ergonomic education; manual work and modalities as indicated    [employer], PT 09/07/2023, 1:07 PM

## 2023-09-07 ENCOUNTER — Other Ambulatory Visit: Payer: Self-pay

## 2023-09-07 ENCOUNTER — Encounter: Payer: Self-pay | Admitting: Rehabilitative and Restorative Service Providers"

## 2023-09-07 ENCOUNTER — Ambulatory Visit: Attending: Internal Medicine | Admitting: Rehabilitative and Restorative Service Providers"

## 2023-09-07 DIAGNOSIS — M6281 Muscle weakness (generalized): Secondary | ICD-10-CM | POA: Insufficient documentation

## 2023-09-07 DIAGNOSIS — R293 Abnormal posture: Secondary | ICD-10-CM | POA: Diagnosis not present

## 2023-09-07 DIAGNOSIS — J479 Bronchiectasis, uncomplicated: Secondary | ICD-10-CM | POA: Insufficient documentation

## 2023-09-07 DIAGNOSIS — M5459 Other low back pain: Secondary | ICD-10-CM

## 2023-09-07 DIAGNOSIS — M545 Low back pain, unspecified: Secondary | ICD-10-CM | POA: Diagnosis not present

## 2023-09-07 DIAGNOSIS — G8929 Other chronic pain: Secondary | ICD-10-CM | POA: Diagnosis not present

## 2023-09-13 ENCOUNTER — Ambulatory Visit: Admitting: Rehabilitative and Restorative Service Providers"

## 2023-09-13 ENCOUNTER — Encounter: Payer: Self-pay | Admitting: Rehabilitative and Restorative Service Providers"

## 2023-09-13 DIAGNOSIS — R293 Abnormal posture: Secondary | ICD-10-CM

## 2023-09-13 DIAGNOSIS — M6281 Muscle weakness (generalized): Secondary | ICD-10-CM | POA: Diagnosis not present

## 2023-09-13 DIAGNOSIS — M5459 Other low back pain: Secondary | ICD-10-CM

## 2023-09-13 DIAGNOSIS — G8929 Other chronic pain: Secondary | ICD-10-CM | POA: Diagnosis not present

## 2023-09-13 DIAGNOSIS — J479 Bronchiectasis, uncomplicated: Secondary | ICD-10-CM | POA: Diagnosis not present

## 2023-09-13 DIAGNOSIS — M545 Low back pain, unspecified: Secondary | ICD-10-CM | POA: Diagnosis not present

## 2023-09-13 NOTE — Therapy (Signed)
 OUTPATIENT PHYSICAL THERAPY THORACOLUMBAR EVALUATION   Patient Name: Amber Norris MRN: [id number] DOB:[birth year], 78 y.o., female Today's Date: 09/13/2023  END OF SESSION:  PT End of Session - 09/13/23 1532     Visit Number 2    Number of Visits 16    Date for PT Re-Evaluation 11/02/23    Authorization Type [employer] medicare $20 copay    Authorization Time Period auth required    PT Start Time 1530    PT Stop Time 1615    PT Time Calculation (min) 45 min          Past Medical History:  Diagnosis Date   Hyperlipemia    Past Surgical History:  Procedure Laterality Date   ABDOMINAL HYSTERECTOMY     FOOT SURGERY     SHOULDER SURGERY     Patient Active Problem List   Diagnosis Date Noted   Bronchiectasis without complication (HCC) 06/23/2022   Former smoker 06/23/2022    PCP: Dr Velna JONELLE Skeeter  REFERRING PROVIDER: Dr Velna JONELLE Skeeter  REFERRING DIAG: Low back pain   Rationale for Evaluation and Treatment: Rehabilitation  THERAPY DIAG:  Other low back pain  Muscle weakness (generalized)  Abnormal posture  ONSET DATE: 05/09/23  SUBJECTIVE:                                                                                                                                                                                           SUBJECTIVE STATEMENT: Patient reports that she has been working on her exercises at home and thinks they are helping. Still has the most trouble when she gets up in the morning. Sleeps on her side. Mostly on the L side.   Eval: Patient reports that she has had increased LBP since 4/25. She has had some LBP for several months or years on an intermittent basis. She was treated with medication and exercises with no significant improvement.   PERTINENT HISTORY:  Chronic LBP, osteoporosis; osteoarthritis; lung disease; lumbar fractures most recent ~ 2 yrs ago   PAIN:  Are you having pain? Yes: NPRS scale: 5 Pain location: L > R posterior hips and  some in the low back  Pain description: dull ache - intermittent  Aggravating factors: sitting > 5-10 min; bending; lifting; reaching; dressing putting pants on  Relieving factors: standing; heating pad  PRECAUTIONS: Back - osteoporosis   WEIGHT BEARING RESTRICTIONS: No  FALLS:  Has patient fallen in last 6 months? 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  DIAGNOSTIC FINDINGS:  Xray lumbar spine: 06/22/23: Interpretation: There are mild compression fractures of the T12-L4 vertebral bodies. There is grade 1 retrolisthesis of L1 on L2 and L2 on L3. There is disc space loss that is most severe at L1-2 and L5-S1 and there is lower lumbar facet arthropathy. There is mild right convex curvature.   PATIENT SURVEYS:  Modified Oswestry: 28/50; 56%   SENSATION: WFL  MUSCLE LENGTH: Hamstrings: Right 70 deg; Left 70 deg Tight hip flexors Quads tight ~ 90 deg flex prone   POSTURE: rounded shoulders, forward head, increased lumbar lordosis, increased thoracic kyphosis, flexed trunk , and L LE/hemipelvis higher than R  PALPATION: Tender R hip flexors; L > R lumbar paraspinals; QL; lats along the crest of the pelvis   LUMBAR ROM:   AROM eval  Flexion 65% pain L LB  Extension 50% feels good   Right lateral flexion 85% pain R side  Left lateral flexion 90% pull no pain  Right rotation 50%  Left rotation 50%   (Blank rows = not tested)  LOWER EXTREMITY ROM:   tight end  ranges bilat hips   Active  Right eval Left eval  Hip flexion    Hip extension    Hip abduction    Hip adduction    Hip internal rotation    Hip external rotation    Knee flexion(prone)  90  90   Knee extension    Ankle dorsiflexion    Ankle plantarflexion    Ankle inversion    Ankle eversion     (Blank rows = not tested)  LOWER EXTREMITY MMT:    MMT Right eval Left eval  Hip flexion 5 5  Hip extension 4 4  Hip abduction 5 4+  Hip adduction    Hip internal rotation    Hip external rotation    Knee flexion    Knee extension    Ankle dorsiflexion    Ankle plantarflexion    Ankle inversion    Ankle eversion     (Blank rows = not tested)  LUMBAR SPECIAL TESTS:  Straight leg raise test: Negative and Slump test: Negative  FUNCTIONAL TESTS:  5 times sit to stand:  SLS R with UE support 10 sec; L 5 sec with LOB     GAIT: Distance walked: 40 feet  Assistive device utilized: None Level of assistance: Complete Independence Comments: fwd flexed posture  [facility] Adult PT Treatment:                                                DATE: 09/13/23 Therapeutic Exercise: Quad stretch 30 sec x 2 R/L  Lat stretch supine 30 sec x 2 (some shoulder pain - hold for home) Manual Therapy: STM L lower lumbar/QL/SI area  Neuromuscular re-ed: Core activation transverse abdominals 10 sec x 10  Therapeutic Activity: Single knee to chest 10 sec x 5 R/L  Double knee to chest 10 sec x 5  Shoulder flexion red TB core  engaged x 10  Supine trunk rotation 10 sec x 3 R/L  Self Care: Sleeping positions - demonstrated and tried lying on L side pillow between knees and pillow to hug    TREATMENT DATE: 09/07/23 POC reviewed  HEP      Rolling and coming to sit with core engaged                                                                                                                             PATIENT EDUCATION:  Education details: POC; HEP  Person educated: Patient Education method:  Programmer, multimedia, Facilities manager, Actor cues, Verbal cues, and Handouts Education comprehension: verbalized understanding, returned demonstration, verbal cues required, tactile cues required, and needs further education  HOME EXERCISE PROGRAM: Access Code: [email or web address] URL: [email or web address] Date: 09/13/2023 Prepared by: Erice Ahles  Program Notes lying on back hips and knees bentbring knees toward chestturn hands toward face push hands over head hold 30 sec bring arms down then bring legs down   Exercises - Supine Transversus Abdominis Bracing with Pelvic Floor Contraction  - 2 x daily - 7 x weekly - 1 sets - 10 reps - 10sec  hold - Prone Quadriceps Stretch with Strap  - 1 x daily - 7 x weekly - 1 sets - 3 reps - 30 sec  hold - Supine Shoulder Flexion Extension AAROM with Dowel  - 1 x daily - 7 x weekly - 1 sets - 10 reps - 2-3 sec  hold - Hooklying Single Knee to Chest Stretch  - 2 x daily - 7 x weekly - 1 sets - 3-5 reps - 10-15 sec  hold - Supine Double Knee to Chest  - 2 x daily - 7 x weekly - 1 sets - 3-5 reps - 10 sec  hold - Supine Lower Trunk Rotation  - 2 x daily - 7 x weekly - 1 sets - 3-5 reps - 15-20 sec  hold  ASSESSMENT:  CLINICAL IMPRESSION: Patient returns with continued L lower lumbar pain and tightness to palpation. She has worked on her exercises at home. Continued exercises. Will hold lat stretch due to shoulder pain. Reviewed and progressed exercises working on core activation - transverse abdominals only. Tolerated all additional exercises well. Will consider trial of lift R shoe to help improve alignment. Patient demonstrated leg length difference in standing with R PSIS and iliac crest lower than L.   Eval: Patient is a 78 y.o. female who was seen today for physical therapy evaluation and treatment for low back pain. She has a history of recurrent LBP with degenerative changes in the lumbar spine. Patient presents today with pain in the L > R LB and  posterior hip area. She has poor posture and alignment; limited trunk and LE mobility/ROM/strength; poor core strength for transitional movements and transfers. She has palpable tightness in L > R hip flexors, R lumbar paraspinals; lats/lumbar paraspinals. She has pain with functional activities. Patient will benefit  from PT to address problems identified.    GOALS: Goals reviewed with patient? Yes  SHORT TERM GOALS: Target date: 10/05/2023   Independent in initial HEP Baseline: Goal status: INITIAL  2.  Patient will demonstrate and verbalize improved/proper transfers and transitional movements  Baseline:  Goal status: INITIAL   LONG TERM GOALS: Target date: 11/02/2023   Decrease pain by 75-80%  Baseline:  Goal status: INITIAL  2.  Increased core strength and LE strength to 5/5  Baseline:  Goal status: INITIAL  3.  Patient reports ability to walk 3-7 miles with no increase in pain  Baseline:  Goal status: INITIAL  4.  Patient reports ability to sleep through the night without awakening due to pain  Baseline:  Goal status: INITIAL  5.  Independent in advanced HEP  Baseline:  Goal status: INITIAL  6.  Improve Modified Oswestry Score by 10-15%  Baseline: 28/50; 56%  Goal status: INITIAL  PLAN:  PT FREQUENCY: 2x/week  PT DURATION: 8 weeks  PLANNED INTERVENTIONS: 97164- PT Re-evaluation, 97110-Therapeutic exercises, 97530- Therapeutic activity, 97112- Neuromuscular re-education, 97535- Self Care, 02859- Manual therapy, [phone]- Aquatic Therapy, Patient/Family education, and Taping  PLAN FOR NEXT SESSION: review and progress exercises; continue with spine care and ergonomic education; manual work and modalities as indicated    Artie Takayama P Jaskaran Dauzat, PT 09/13/2023, 4:21 PM

## 2023-09-16 ENCOUNTER — Encounter: Payer: Self-pay | Admitting: Rehabilitative and Restorative Service Providers"

## 2023-09-16 ENCOUNTER — Ambulatory Visit: Payer: Self-pay | Admitting: Rehabilitative and Restorative Service Providers"

## 2023-09-16 DIAGNOSIS — G8929 Other chronic pain: Secondary | ICD-10-CM | POA: Diagnosis not present

## 2023-09-16 DIAGNOSIS — R293 Abnormal posture: Secondary | ICD-10-CM | POA: Diagnosis not present

## 2023-09-16 DIAGNOSIS — M6281 Muscle weakness (generalized): Secondary | ICD-10-CM | POA: Diagnosis not present

## 2023-09-16 DIAGNOSIS — M5459 Other low back pain: Secondary | ICD-10-CM

## 2023-09-16 DIAGNOSIS — J479 Bronchiectasis, uncomplicated: Secondary | ICD-10-CM | POA: Diagnosis not present

## 2023-09-16 DIAGNOSIS — M545 Low back pain, unspecified: Secondary | ICD-10-CM | POA: Diagnosis not present

## 2023-09-16 NOTE — Therapy (Signed)
 OUTPATIENT PHYSICAL THERAPY THORACOLUMBAR EVALUATION   Patient Name: Amber Norris MRN: [id number] DOB:02/07/1946, 78 y.o., female Today's Date: 09/16/2023  END OF SESSION:  PT End of Session - 09/16/23 0846     Visit Number 3    Number of Visits 16    Date for PT Re-Evaluation 11/02/23    Authorization Type [employer] medicare $20 copay    Authorization Time Period auth required    PT Start Time 0845    PT Stop Time 0930    PT Time Calculation (min) 45 min    Activity Tolerance Patient tolerated treatment well          Past Medical History:  Diagnosis Date   Hyperlipemia    Past Surgical History:  Procedure Laterality Date   ABDOMINAL HYSTERECTOMY     FOOT SURGERY     SHOULDER SURGERY     Patient Active Problem List   Diagnosis Date Noted   Bronchiectasis without complication (HCC) 06/23/2022   Former smoker 06/23/2022    PCP: Dr Velna JONELLE Skeeter  REFERRING PROVIDER: Dr Velna JONELLE Skeeter  REFERRING DIAG: Low back pain   Rationale for Evaluation and Treatment: Rehabilitation  THERAPY DIAG:  Other low back pain  Muscle weakness (generalized)  Abnormal posture  ONSET DATE: 05/09/23  SUBJECTIVE:                                                                                                                                                                                           SUBJECTIVE STATEMENT: Patient reports that she has continued pain in the L LB. She has pain in the L LB with lifting L leg into bed or lifting leg up. She is working on her exercises at home. The heel lift feels different and she feels straighter. Still has the most trouble when she gets up in the morning. Sleeps on her side. Mostly on the L side. Has tried pillow between knees. She has stopped sitting in her recliner and she is now sitting on the couch or love seat. Still sits in the recliner in the morning for her Bible reading usually about an hour.    Eval: Patient reports that she has had  increased LBP since 4/25. She has had some LBP for several months or years on an intermittent basis. She was treated with medication and exercises with no significant improvement.   PERTINENT HISTORY:  Chronic LBP, osteoporosis; osteoarthritis; lung disease; lumbar fractures most recent ~ 2 yrs ago   PAIN:  Are you having pain? Yes: NPRS scale: 3-4/10 Pain location: L > R posterior hips and some in the low back  Pain description: dull ache - intermittent  Aggravating factors: sitting > 5-10 min; bending; lifting; reaching; dressing putting pants on  Relieving factors: standing; heating pad  PRECAUTIONS: Back - osteoporosis   WEIGHT BEARING RESTRICTIONS: No  FALLS:  Has patient fallen in last 6 months? 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  DIAGNOSTIC FINDINGS:  Xray lumbar spine: 06/22/23: Interpretation: There are mild compression fractures of the T12-L4 vertebral bodies. There is grade 1 retrolisthesis of L1 on L2 and L2 on L3. There is disc space loss that is most severe at L1-2 and L5-S1 and there is lower lumbar facet arthropathy. There is mild right convex curvature.   PATIENT SURVEYS:  Modified Oswestry: 28/50; 56%   SENSATION: WFL  MUSCLE LENGTH: Hamstrings: Right 70 deg; Left 70 deg Tight hip flexors Quads tight ~ 90 deg flex prone   POSTURE: rounded shoulders, forward head, increased lumbar lordosis, increased thoracic kyphosis, flexed trunk , and L  LE/hemipelvis higher than R  PALPATION: Tender R hip flexors; L > R lumbar paraspinals; QL; lats along the crest of the pelvis   LUMBAR ROM:   AROM eval  Flexion 65% pain L LB  Extension 50% feels good   Right lateral flexion 85% pain R side  Left lateral flexion 90% pull no pain  Right rotation 50%  Left rotation 50%   (Blank rows = not tested)  LOWER EXTREMITY ROM:   tight end ranges bilat hips   Active  Right eval Left eval  Hip flexion    Hip extension    Hip abduction    Hip adduction    Hip internal rotation    Hip external rotation    Knee flexion(prone)  90  90   Knee extension    Ankle dorsiflexion    Ankle plantarflexion    Ankle inversion    Ankle eversion     (Blank rows = not tested)  LOWER EXTREMITY MMT:    MMT Right eval Left eval  Hip flexion 5 5  Hip extension 4 4  Hip abduction 5 4+  Hip adduction    Hip internal rotation    Hip external rotation    Knee flexion    Knee extension    Ankle dorsiflexion    Ankle plantarflexion    Ankle inversion    Ankle eversion     (Blank rows = not tested)  LUMBAR SPECIAL TESTS:  Straight leg raise test: Negative and Slump test: Negative  FUNCTIONAL TESTS:  5 times sit to stand:  SLS R with UE support 10 sec; L 5 sec with LOB     GAIT: Distance walked: 40 feet  Assistive device utilized: None Level of assistance: Complete Independence Comments: fwd flexed posture  [facility] Adult PT Treatment:  DATE: 09/16/23 Therapeutic Exercise: Quad stretch 30 sec x 2 R/L  Neuromuscular re-ed: Core activation transverse abdominals 10 sec x 10  Therapeutic Activity: Single knee to chest 10 sec x 5 R/L  Double knee to chest 10 sec x 5  Shoulder flexion red TB core engaged x 10  March alternating LE's 5 sec x 10 Rt/Lt Hip abduction with red TB alternating LE's 5 sec x 10 Rt/Lt  Self Care: Heel lift added to R shoe - worked on standing and walking with  lift Discussed modifications for sitting to improve firmer support for back practiced sitting with pillow and noodle at back Sleeping positions - demonstrated and tried lying on L side pillow between knees and pillow to hug    [facility] Adult PT Treatment:                                                DATE: 09/13/23 Therapeutic Exercise: Quad stretch 30 sec x 2 R/L  Lat stretch supine 30 sec x 2 (some shoulder pain - hold for home) Manual Therapy: STM L lower lumbar/QL/SI area  Neuromuscular re-ed: Core activation transverse abdominals 10 sec x 10  Therapeutic Activity: Single knee to chest 10 sec x 5 R/L  Double knee to chest 10 sec x 5  Shoulder flexion red TB core engaged x 10  Supine trunk rotation 10 sec x 3 R/L  Self Care: Sleeping positions - demonstrated and tried lying on L side pillow between knees and pillow to hug    TREATMENT DATE: 09/07/23 POC reviewed  HEP      Rolling and coming to sit with core engaged                                                                                                                             PATIENT EDUCATION:  Education details: POC; HEP  Person educated: Patient Education method: Programmer, multimedia, Facilities manager, Actor cues, Verbal cues, and Handouts Education comprehension: verbalized understanding, returned demonstration, verbal cues required, tactile cues required, and needs further education  HOME EXERCISE PROGRAM: Access Code: [email or web address] URL: [email or web address] Date: 09/16/2023 Prepared by: Harlynn Kimbell  Program Notes lying on back hips and knees bentbring knees toward chestturn hands toward face push hands over head hold 30 sec bring arms down then bring legs down   Exercises - Supine Transversus Abdominis Bracing with Pelvic Floor Contraction  - 2 x daily - 7 x weekly - 1 sets - 10 reps - 10sec  hold - Prone Quadriceps Stretch with Strap  - 1 x daily - 7 x weekly - 1 sets - 3 reps - 30 sec  hold - Supine  Shoulder Flexion Extension AAROM with Dowel  - 1 x daily - 7 x weekly - 1 sets - 10 reps - 2-3 sec  hold - Hooklying  Single Knee to Chest Stretch  - 2 x daily - 7 x weekly - 1 sets - 3-5 reps - 10-15 sec  hold - Supine Double Knee to Chest  - 2 x daily - 7 x weekly - 1 sets - 3-5 reps - 10 sec  hold - Supine Lower Trunk Rotation  - 2 x daily - 7 x weekly - 1 sets - 3-5 reps - 15-20 sec  hold - Supine March  - 2 x daily - 7 x weekly - 1-2 sets - 3 reps - 2 sec  hold - Hooklying Isometric Clamshell  - 2 x daily - 7 x weekly - 1 sets - 10 reps - 3 sec  hold  ASSESSMENT:  CLINICAL IMPRESSION: Patient reports continued L lower lumbar pain. Pain is increased with transitional movement in and out of bed or sit to stand after she has been sitting for any length of time. She has tightness to palpation L lower lumbar musculature. Continued exercises working on core stabilization as tolerated. Will hold lat stretch due to shoulder pain and hold supine trunk rotation. Improved core activation - transverse abdominals only. Trial of 5/8's inch lift R shoe which improved pelvic alignment. Patient demonstrated leg length difference in standing with R PSIS and iliac crest lower than L without lift.  Eval: Patient is a 78 y.o. female who was seen today for physical therapy evaluation and treatment for low back pain. She has a history of recurrent LBP with degenerative changes in the lumbar spine. Patient presents today with pain in the L > R LB and posterior hip area. She has poor posture and alignment; limited trunk and LE mobility/ROM/strength; poor core strength for transitional movements and transfers. She has palpable tightness in L > R hip flexors, R lumbar paraspinals; lats/lumbar paraspinals. She has pain with functional activities. Patient will benefit from PT to address problems identified.    GOALS: Goals reviewed with patient? Yes  SHORT TERM GOALS: Target date: 10/05/2023   Independent in initial  HEP Baseline: Goal status: INITIAL  2.  Patient will demonstrate and verbalize improved/proper transfers and transitional movements  Baseline:  Goal status: INITIAL   LONG TERM GOALS: Target date: 11/02/2023   Decrease pain by 75-80%  Baseline:  Goal status: INITIAL  2.  Increased core strength and LE strength to 5/5  Baseline:  Goal status: INITIAL  3.  Patient reports ability to walk 3-7 miles with no increase in pain  Baseline:  Goal status: INITIAL  4.  Patient reports ability to sleep through the night without awakening due to pain  Baseline:  Goal status: INITIAL  5.  Independent in advanced HEP  Baseline:  Goal status: INITIAL  6.  Improve Modified Oswestry Score by 10-15%  Baseline: 28/50; 56%  Goal status: INITIAL  PLAN:  PT FREQUENCY: 2x/week  PT DURATION: 8 weeks  PLANNED INTERVENTIONS: 97164- PT Re-evaluation, 97110-Therapeutic exercises, 97530- Therapeutic activity, 97112- Neuromuscular re-education, 97535- Self Care, [id number]- Manual therapy, [phone]- Aquatic Therapy, Patient/Family education, and Taping  PLAN FOR NEXT SESSION: review and progress exercises; continue with spine care and ergonomic education; manual work and modalities as indicated    [employer], PT 09/16/2023, 8:47 AM

## 2023-09-20 ENCOUNTER — Encounter: Payer: Self-pay | Admitting: Rehabilitative and Restorative Service Providers"

## 2023-09-20 ENCOUNTER — Ambulatory Visit: Admitting: Rehabilitative and Restorative Service Providers"

## 2023-09-20 DIAGNOSIS — R293 Abnormal posture: Secondary | ICD-10-CM

## 2023-09-20 DIAGNOSIS — G8929 Other chronic pain: Secondary | ICD-10-CM | POA: Diagnosis not present

## 2023-09-20 DIAGNOSIS — M6281 Muscle weakness (generalized): Secondary | ICD-10-CM | POA: Diagnosis not present

## 2023-09-20 DIAGNOSIS — M5459 Other low back pain: Secondary | ICD-10-CM

## 2023-09-20 DIAGNOSIS — M545 Low back pain, unspecified: Secondary | ICD-10-CM | POA: Diagnosis not present

## 2023-09-20 DIAGNOSIS — J479 Bronchiectasis, uncomplicated: Secondary | ICD-10-CM | POA: Diagnosis not present

## 2023-09-20 NOTE — Therapy (Signed)
 OUTPATIENT PHYSICAL THERAPY THORACOLUMBAR EVALUATION   Patient Name: Amber Norris MRN: [id number] DOB:March 15, 1945, 78 y.o., female Today's Date: 09/20/2023  END OF SESSION:  PT End of Session - 09/20/23 1450     Visit Number 4    Number of Visits 16    Date for PT Re-Evaluation 11/02/23    Authorization Type [employer] medicare $20 copay    Authorization Time Period auth required    Progress Note Due on Visit 10    PT Start Time 1450    PT Stop Time 1535    PT Time Calculation (min) 45 min          Past Medical History:  Diagnosis Date   Hyperlipemia    Past Surgical History:  Procedure Laterality Date   ABDOMINAL HYSTERECTOMY     FOOT SURGERY     SHOULDER SURGERY     Patient Active Problem List   Diagnosis Date Noted   Bronchiectasis without complication (HCC) 06/23/2022   Former smoker 06/23/2022    PCP: Dr Velna JONELLE Skeeter  REFERRING PROVIDER: Dr Velna JONELLE Skeeter  REFERRING DIAG: Low back pain   Rationale for Evaluation and Treatment: Rehabilitation  THERAPY DIAG:  Other low back pain  Muscle weakness (generalized)  Abnormal posture  ONSET DATE: 05/09/23  SUBJECTIVE:                                                                                                                                                                                           SUBJECTIVE STATEMENT: Patient reports that she noticed some improvement in the L LB pain. She is noticing that she can getting in and out of the car and in and out the bed sometimes but not always. Sometimes has to help lift L leg into bed or car using hands. She is working on her exercises at home. She thinks the heel lift is helping. She wears it when she is out but is still in her flip flops when she is in her house. Still has the most trouble when she gets up in the morning. Sleeps on her side. Mostly on the L side. Has tried pillow between knees. She has stopped sitting in her recliner and she is now sitting on the  couch or love seat. Still sits in the recliner in the morning for her Bible reading usually about an hour.    Eval: Patient reports that she has had increased LBP since 4/25. She has had some LBP for several months or years on an intermittent basis. She was treated with medication and exercises with no significant improvement.   PERTINENT HISTORY:  Chronic  LBP, osteoporosis; osteoarthritis; lung disease; lumbar fractures most recent ~ 2 yrs ago   PAIN:  Are you having pain? Yes: NPRS scale: 0/10 Pain location: L > R posterior hips and some in the low back  Pain description: dull ache - intermittent  Aggravating factors: sitting > 5-10 min; bending; lifting; reaching; dressing putting pants on  Relieving factors: standing; heating pad  PRECAUTIONS: Back - osteoporosis   WEIGHT BEARING RESTRICTIONS: No  FALLS:  Has patient fallen in last 6 months? 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  DIAGNOSTIC FINDINGS:  Xray lumbar spine: 06/22/23: Interpretation: There are mild compression fractures of the T12-L4 vertebral bodies. There is grade 1 retrolisthesis of L1 on L2 and L2 on L3. There is disc space loss that is most severe at L1-2 and L5-S1 and there is lower lumbar facet arthropathy. There is mild right convex curvature.   PATIENT SURVEYS:  Modified Oswestry: 28/50; 56%   SENSATION: WFL  MUSCLE LENGTH: Hamstrings: Right 70 deg; Left 70 deg Tight hip flexors Quads  tight ~ 90 deg flex prone   POSTURE: rounded shoulders, forward head, increased lumbar lordosis, increased thoracic kyphosis, flexed trunk , and L LE/hemipelvis higher than R  PALPATION: Tender R hip flexors; L > R lumbar paraspinals; QL; lats along the crest of the pelvis   LUMBAR ROM:   AROM eval  Flexion 65% pain L LB  Extension 50% feels good   Right lateral flexion 85% pain R side  Left lateral flexion 90% pull no pain  Right rotation 50%  Left rotation 50%   (Blank rows = not tested)  LOWER EXTREMITY ROM:   tight end ranges bilat hips   Active  Right eval Left eval  Hip flexion    Hip extension    Hip abduction    Hip adduction    Hip internal rotation    Hip external rotation    Knee flexion(prone)  90  90   Knee extension    Ankle dorsiflexion    Ankle plantarflexion    Ankle inversion    Ankle eversion     (Blank rows = not tested)  LOWER EXTREMITY MMT:    MMT Right eval Left eval  Hip flexion 5 5  Hip extension 4 4  Hip abduction 5 4+  Hip adduction    Hip internal rotation    Hip external rotation    Knee flexion    Knee extension    Ankle dorsiflexion    Ankle plantarflexion    Ankle inversion    Ankle eversion     (Blank rows = not tested)  LUMBAR SPECIAL TESTS:  Straight leg raise test: Negative and Slump test: Negative  FUNCTIONAL TESTS:  5 times sit to stand:  SLS R with UE support 10 sec; L 5 sec with LOB     GAIT: Distance walked: 40 feet  Assistive device utilized: None Level of assistance: Complete Independence Comments: fwd flexed posture  [facility] Adult PT Treatment:  DATE: 09/20/23 Therapeutic Exercise: Quad stretch 30 sec x 2 R/L (painful in prone tried in sitting - minimal stretch will hold for now)  Trunk rotation in hooklying 5 sec x 5 Rt/Lt Neuromuscular re-ed: Core activation transverse abdominals 10 sec x 10  Therapeutic Activity: Single knee to chest 10 sec x 5 R/L   Double knee to chest 10 sec x 5  Shoulder flexion red TB core engaged x 10  March alternating LE's 5 sec x 10 Rt/Lt Hip abduction with green TB alternating LE's 5 sec x 10 Rt/Lt  Bridge 3 sec x 10  Row green TB 3 sec x 10 x 3 Shoulder extension green TB 3 sec x 10  Self Care: Continue use of heel lift added to R shoe - encouraged patient to use shoes at home in which she can use the heel lift Encouraged continued modifications for sitting w/firmer support for back  Sleeping positions - demonstrated and tried lying on L side pillow between knees and pillow to hug    [facility] Adult PT Treatment:                                                DATE: 09/16/23 Therapeutic Exercise: Quad stretch 30 sec x 2 R/L  Neuromuscular re-ed: Core activation transverse abdominals 10 sec x 10  Therapeutic Activity: Single knee to chest 10 sec x 5 R/L  Double knee to chest 10 sec x 5  Shoulder flexion red TB core engaged x 10  March alternating LE's 5 sec x 10 Rt/Lt Hip abduction with red TB alternating LE's 5 sec x 10 Rt/Lt  Self Care: Heel lift added to R shoe - worked on standing and walking with lift Discussed modifications for sitting to improve firmer support for back practiced sitting with pillow and noodle at back Sleeping positions - demonstrated and tried lying on L side pillow between knees and pillow to hug    [facility] Adult PT Treatment:                                                DATE: 09/13/23 Therapeutic Exercise: Quad stretch 30 sec x 2 R/L  Lat stretch supine 30 sec x 2 (some shoulder pain - hold for home) Manual Therapy: STM L lower lumbar/QL/SI area  Neuromuscular re-ed: Core activation transverse abdominals 10 sec x 10  Therapeutic Activity: Single knee to chest 10 sec x 5 R/L  Double knee to chest 10 sec x 5  Shoulder flexion red TB core engaged x 10  Supine trunk rotation 10 sec x 3 R/L  Self Care: Sleeping positions - demonstrated and tried lying on L side pillow between  knees and pillow to hug    TREATMENT DATE: 09/07/23 POC reviewed  HEP      Rolling and coming to sit with core engaged  PATIENT EDUCATION:  Education details: POC; HEP  Person educated: Patient Education method: Programmer, multimedia, Demonstration, Actor cues, Verbal cues, and Handouts Education comprehension: verbalized understanding, returned demonstration, verbal cues required, tactile cues required, and needs further education  HOME EXERCISE PROGRAM: Access Code: [email or web address] Access Code: [email or web address] URL: [email or web address] Date: 09/20/2023 Prepared by: Manas Hickling  Program Notes lying on back hips and knees bentbring knees toward chestturn hands toward face push hands over head hold 30 sec bring arms down then bring legs down   Exercises - Supine Transversus Abdominis Bracing with Pelvic Floor Contraction  - 2 x daily - 7 x weekly - 1 sets - 10 reps - 10sec  hold - Supine Shoulder Flexion Extension AAROM with Dowel  - 1 x daily - 7 x weekly - 1 sets - 10 reps - 2-3 sec  hold - Hooklying Single Knee to Chest Stretch  - 1 x daily - 7 x weekly - 1 sets - 3-5 reps - 10-15 sec  hold - Supine Double Knee to Chest  - 1 x daily - 7 x weekly - 1 sets - 3-5 reps - 10 sec  hold - Supine Lower Trunk Rotation  - 1 x daily - 7 x weekly - 1 sets - 3-5 reps - 15-20 sec  hold - Supine March  - 1 x daily - 7 x weekly - 1-2 sets - 3 reps - 2 sec  hold - Hooklying Isometric Clamshell  - 1 x daily - 7 x weekly - 1 sets - 10 reps - 3 sec  hold - Supine Bridge  - 1 x daily - 7 x weekly - 1-2 sets - 10 reps - 3-5 sec  hold - Standing Bilateral Low Shoulder Row with Anchored Resistance  - 2 x daily - 7 x weekly - 1-3 sets - 10 reps - 2-3 sec  hold - Shoulder extension with resistance - Neutral  - 1 x daily - 7 x weekly - 1-2 sets - 10 reps - 3-5 sec   hold  ASSESSMENT:  CLINICAL IMPRESSION: Patient reports some improvement in L lower lumbar pain. Pain is some better at times with transitional movements including getting in and out of her car; in and out of bed or sit to stand after she has been sitting for any length of time. She has tightness to palpation L lower lumbar musculature. Continued exercises working on core stabilization as tolerated. Kya tolerated a bridge in supine today. Added bridge for home as well as lumbar stabilization in standing. Positive response to trial of 5/8's inch lift R shoe which improved pelvic alignment. Encouraged Dagoberto use lift in shoes when she is at home and not only when she is outside the home. She wears flip flops when she is at home. (Patient demonstrated leg length difference in standing with R PSIS and iliac crest lower than L without lift.)  Eval: Patient is a 78 y.o. female who was seen today for physical therapy evaluation and treatment for low back pain. She has a history of recurrent LBP with degenerative changes in the lumbar spine. Patient presents today with pain in the L > R LB and posterior hip area. She has poor posture and alignment; limited trunk and LE mobility/ROM/strength; poor core strength for transitional movements and transfers. She has palpable tightness in L > R hip flexors, R lumbar paraspinals; lats/lumbar paraspinals. She has pain with functional activities. Patient will benefit from PT to address problems identified.    GOALS: Goals  reviewed with patient? Yes  SHORT TERM GOALS: Target date: 10/05/2023   Independent in initial HEP Baseline: Goal status: INITIAL  2.  Patient will demonstrate and verbalize improved/proper transfers and transitional movements  Baseline:  Goal status: INITIAL   LONG TERM GOALS: Target date: 11/02/2023   Decrease pain by 75-80%  Baseline:  Goal status: INITIAL  2.  Increased core strength and LE strength to 5/5  Baseline:  Goal status:  INITIAL  3.  Patient reports ability to walk 3-7 miles with no increase in pain  Baseline:  Goal status: INITIAL  4.  Patient reports ability to sleep through the night without awakening due to pain  Baseline:  Goal status: INITIAL  5.  Independent in advanced HEP  Baseline:  Goal status: INITIAL  6.  Improve Modified Oswestry Score by 10-15%  Baseline: 28/50; 56%  Goal status: INITIAL  PLAN:  PT FREQUENCY: 2x/week  PT DURATION: 8 weeks  PLANNED INTERVENTIONS: 97164- PT Re-evaluation, 97110-Therapeutic exercises, 97530- Therapeutic activity, 97112- Neuromuscular re-education, 97535- Self Care, 02859- Manual therapy, [phone]- Aquatic Therapy, Patient/Family education, and Taping  PLAN FOR NEXT SESSION: review and progress exercises; continue with spine care and ergonomic education; manual work and modalities as indicated    [employer], PT 09/20/2023, 2:51 PM

## 2023-09-22 ENCOUNTER — Ambulatory Visit: Admitting: Rehabilitative and Restorative Service Providers"

## 2023-09-22 DIAGNOSIS — R293 Abnormal posture: Secondary | ICD-10-CM

## 2023-09-22 DIAGNOSIS — G8929 Other chronic pain: Secondary | ICD-10-CM | POA: Diagnosis not present

## 2023-09-22 DIAGNOSIS — M545 Low back pain, unspecified: Secondary | ICD-10-CM | POA: Diagnosis not present

## 2023-09-22 DIAGNOSIS — M6281 Muscle weakness (generalized): Secondary | ICD-10-CM

## 2023-09-22 DIAGNOSIS — M5459 Other low back pain: Secondary | ICD-10-CM

## 2023-09-22 DIAGNOSIS — J479 Bronchiectasis, uncomplicated: Secondary | ICD-10-CM | POA: Diagnosis not present

## 2023-09-22 NOTE — Therapy (Signed)
 OUTPATIENT PHYSICAL THERAPY THORACOLUMBAR EVALUATION   Patient Name: Amber Norris MRN: [id number] DOB:05-Dec-1945, 78 y.o., female Today's Date: 09/22/2023  END OF SESSION:  PT End of Session - 09/22/23 1104     Visit Number 5    Number of Visits 16    Date for PT Re-Evaluation 11/02/23    Authorization Type [employer] medicare $20 copay    Authorization Time Period auth required    Progress Note Due on Visit 10    PT Start Time 1100    PT Stop Time 1145    PT Time Calculation (min) 45 min    Activity Tolerance Patient tolerated treatment well          Past Medical History:  Diagnosis Date   Hyperlipemia    Past Surgical History:  Procedure Laterality Date   ABDOMINAL HYSTERECTOMY     FOOT SURGERY     SHOULDER SURGERY     Patient Active Problem List   Diagnosis Date Noted   Bronchiectasis without complication (HCC) 06/23/2022   Former smoker 06/23/2022    PCP: Dr Velna JONELLE Skeeter  REFERRING PROVIDER: Dr Velna JONELLE Skeeter  REFERRING DIAG: Low back pain   Rationale for Evaluation and Treatment: Rehabilitation  THERAPY DIAG:  Other low back pain  Muscle weakness (generalized)  Abnormal posture  ONSET DATE: 05/09/23  SUBJECTIVE:                                                                                                                                                                                           SUBJECTIVE STATEMENT: Patient reports that she had a good day yesterday with less pain. Some increased discomfort last night and this morning. May have been more active yesterday. Working on her exercises at home. Likes some of the exercises. Does sometimes try to rush through the exercises.   Overall improving with less back pain. She is noticing that she can getting in and out of the car and in and out the bed sometimes but not always. Sometimes has to help lift L leg into bed or car using hands. She is working on her exercises at home. She thinks the heel lift  is helping. She wears it when she is out but is still in her flip flops when she is in her house. Still has the most trouble when she gets up in the morning. Sleeps on her side. Mostly on the L side. Has tried pillow between knees. She has stopped sitting in her recliner and she is now sitting on the couch or love seat. Still sits in the recliner in the morning for her Bible  reading usually about an hour.    Eval: Patient reports that she has had increased LBP since 4/25. She has had some LBP for several months or years on an intermittent basis. She was treated with medication and exercises with no significant improvement.   PERTINENT HISTORY:  Chronic LBP, osteoporosis; osteoarthritis; lung disease; lumbar fractures most recent ~ 2 yrs ago   PAIN:  Are you having pain? Yes: NPRS scale: 4/10 Pain location: L > R posterior hips and some in the low back  Pain description: dull ache - intermittent  Aggravating factors: sitting > 5-10 min; bending; lifting; reaching; dressing putting pants on  Relieving factors: standing; heating pad Pain: Pain is some better at times with transitional movements including getting in and out of her car; in and out of bed or sit to stand after she has been sitting for any length of time.  Heel Lift: Positive response to trial of 5/8's inch lift R shoe which improved pelvic alignment. Encouraged Dagoberto use lift in shoes when she is at home and not only when she is outside the home. She wears flip flops when she is at home. (Patient demonstrated leg length difference in standing with R PSIS and iliac crest lower than L without lift.)   PRECAUTIONS: Back - osteoporosis   WEIGHT BEARING RESTRICTIONS: No  FALLS:  Has patient fallen in last 6 months? 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  DIAGNOSTIC FINDINGS:  Xray lumbar spine: 06/22/23: Interpretation: There are mild compression fractures of the T12-L4 vertebral bodies. There is grade 1 retrolisthesis of L1 on L2 and L2 on L3. There is disc space loss that is most severe at L1-2 and L5-S1 and there is lower lumbar facet arthropathy. There is mild right convex curvature.   PATIENT SURVEYS:  Modified Oswestry: 28/50; 56%   SENSATION: WFL  MUSCLE LENGTH: Hamstrings: Right 70 deg; Left 70 deg Tight hip flexors Quads tight ~ 90 deg flex prone   POSTURE: rounded shoulders, forward head, increased lumbar lordosis, increased thoracic kyphosis, flexed trunk , and L LE/hemipelvis higher than R  PALPATION: Tender R hip flexors; L > R lumbar paraspinals; QL; lats along the crest of the pelvis   LUMBAR ROM:   AROM eval  Flexion 65% pain L LB  Extension 50% feels good   Right lateral flexion 85% pain R side  Left lateral flexion 90% pull no pain  Right rotation 50%  Left rotation 50%   (Blank rows = not tested)  LOWER EXTREMITY ROM:   tight end ranges bilat hips   Active  Right eval Left eval  Hip flexion    Hip extension    Hip abduction    Hip adduction    Hip internal rotation    Hip external rotation    Knee flexion(prone)  90  90   Knee extension    Ankle dorsiflexion    Ankle plantarflexion    Ankle inversion    Ankle eversion     (Blank  rows = not tested)  LOWER EXTREMITY MMT:    MMT Right eval Left eval  Hip flexion 5 5  Hip extension 4 4  Hip abduction 5 4+  Hip adduction    Hip internal rotation    Hip external rotation    Knee flexion    Knee  extension    Ankle dorsiflexion    Ankle plantarflexion    Ankle inversion    Ankle eversion     (Blank rows = not tested)  LUMBAR SPECIAL TESTS:  Straight leg raise test: Negative and Slump test: Negative  FUNCTIONAL TESTS:  5 times sit to stand:  SLS R with UE support 10 sec; L 5 sec with LOB     GAIT: Distance walked: 40 feet  Assistive device utilized: None Level of assistance: Complete Independence Comments: fwd flexed posture  [facility] Adult PT Treatment:                                                DATE: 09/22/23 Therapeutic Exercise: Quad stretch 30 sec x 2 R/L (painful in prone tried in sitting - minimal stretch will hold for now)  Trunk rotation in hooklying 5 sec x 5 Rt/Lt Neuromuscular re-ed: Core activation transverse abdominals 10 sec x 10 added humming with core with good improvement in core activation  Therapeutic Activity: Single knee to chest 10 sec x 5 R/L  Double knee to chest 10 sec x 5  Shoulder flexion red TB core engaged x 10  March alternating LE's 5 sec x 10 Rt/Lt Hip abduction with green TB alternating LE's 5 sec x 10 Rt/Lt  Clam sidelying red TB 3 sec x 10  Bridge 3 sec x 10 with hum  Row green TB 3 sec x 10 x 3 Shoulder extension green TB 3 sec x 10 Supine QL stretch 10-15 sec x 3   Seated lateral trunk flexion over bolster and pillow 20 sec x 2 to the R to stretch L QL  Self Care: Continue use of heel lift R shoe - encouraged patient to use shoes at home in which she can use the heel lift Encouraged continued modifications for sitting w/firmer support for back  Sleeping positions - demonstrated and tried lying on L side pillow between knees and pillow to hug    [facility] Adult PT Treatment:                                                DATE: 09/20/23 Therapeutic Exercise: Quad stretch 30 sec x 2 R/L (painful in prone tried in sitting - minimal stretch will hold for now)  Trunk rotation in hooklying 5 sec x 5 Rt/Lt Neuromuscular re-ed: Core  activation transverse abdominals 10 sec x 10  Therapeutic Activity: Single knee to chest 10 sec x 5 R/L  Double knee to chest 10 sec x 5  Shoulder flexion red TB core engaged x 10  March alternating LE's 5 sec x 10 Rt/Lt Hip abduction with green TB alternating LE's 5 sec x 10 Rt/Lt  Bridge 3 sec x 10  Row green TB 3 sec x 10 x 3 Shoulder extension green TB 3 sec x 10  Self Care: Continue use of heel lift  added to R shoe - encouraged patient to use shoes at home in which she can use the heel lift Encouraged continued modifications for sitting w/firmer support for back  Sleeping positions - demonstrated and tried lying on L side pillow between knees and pillow to hug    [facility] Adult PT Treatment:                                                DATE: 09/16/23 Therapeutic Exercise: Quad stretch 30 sec x 2 R/L  Neuromuscular re-ed: Core activation transverse abdominals 10 sec x 10  Therapeutic Activity: Single knee to chest 10 sec x 5 R/L  Double knee to chest 10 sec x 5  Shoulder flexion red TB core engaged x 10  March alternating LE's 5 sec x 10 Rt/Lt Hip abduction with red TB alternating LE's 5 sec x 10 Rt/Lt  Self Care: Heel lift added to R shoe - worked on standing and walking with lift Discussed modifications for sitting to improve firmer support for back practiced sitting with pillow and noodle at back Sleeping positions - demonstrated and tried lying on L side pillow between knees and pillow to hug   PATIENT EDUCATION:  Education details: POC; HEP  Person educated: Patient Education method: Programmer, multimedia, Facilities manager, Actor cues, Verbal cues, and Handouts Education comprehension: verbalized understanding, returned demonstration, verbal cues required, tactile cues required, and needs further education  HOME EXERCISE PROGRAM: Access Code: [email or web address] URL: [email or web address] Date: 09/22/2023 Prepared by: Pearla Mckinny  Program Notes lying on back hips and knees  bentbring knees toward chestturn hands toward face push hands over head hold 30 sec bring arms down then bring legs down   Exercises - Supine Transversus Abdominis Bracing with Pelvic Floor Contraction  - 2 x daily - 7 x weekly - 1 sets - 10 reps - 10sec  hold - Supine Shoulder Flexion Extension AAROM with Dowel  - 1 x daily - 7 x weekly - 1 sets - 10 reps - 2-3 sec  hold - Hooklying Single Knee to Chest Stretch  - 1 x daily - 7 x weekly - 1 sets - 3-5 reps - 10-15 sec  hold - Supine Double Knee to Chest  - 1 x daily - 7 x weekly - 1 sets - 3-5 reps - 10 sec  hold - Supine Lower Trunk Rotation  - 1 x daily - 7 x weekly - 1 sets - 3-5 reps - 15-20 sec  hold - Supine March  - 1 x daily - 7 x weekly - 1-2 sets - 3 reps - 2 sec  hold - Hooklying Isometric Clamshell  - 1 x daily - 7 x weekly - 1 sets - 10 reps - 3 sec  hold - Supine Bridge  - 1 x daily - 7 x weekly - 1-2 sets - 10 reps - 3-5 sec  hold - Standing Bilateral Low Shoulder Row with Anchored Resistance  - 2 x daily - 7 x weekly - 1-3 sets - 10 reps - 2-3 sec  hold - Shoulder extension with resistance - Neutral  - 1 x daily - 7 x weekly - 1-2 sets - 10 reps - 3-5 sec  hold - Supine Quadratus Lumborum Stretch  - 1-2 x daily - 7 x weekly - 1 sets - 3 reps - 10-15 sec  hold -  Clamshell with Resistance  - 2 x daily - 7 x weekly - 2 sets - 10 reps - 3 sec  hold - Seated Quadratus Lumborum Stretch in Chair  - 2 x daily - 7 x weekly - 1 sets - 3 reps - 20-30 sec  hold  ASSESSMENT:  CLINICAL IMPRESSION: Patient reports some increase pain in the L lower lumbar area. Patient added humming with core activation and with bridge with improvement in technique.  Continued exercises working on core stabilization. Added QL stretch in supine and sitting which was tolerated well     Eval: Patient is a 78 y.o. female who was seen today for physical therapy evaluation and treatment for low back pain. She has a history of recurrent LBP with degenerative  changes in the lumbar spine. Patient presents today with pain in the L > R LB and posterior hip area. She has poor posture and alignment; limited trunk and LE mobility/ROM/strength; poor core strength for transitional movements and transfers. She has palpable tightness in L > R hip flexors, R lumbar paraspinals; lats/lumbar paraspinals. She has pain with functional activities. Patient will benefit from PT to address problems identified.    GOALS: Goals reviewed with patient? Yes  SHORT TERM GOALS: Target date: 10/05/2023   Independent in initial HEP Baseline: Goal status: INITIAL  2.  Patient will demonstrate and verbalize improved/proper transfers and transitional movements  Baseline:  Goal status: INITIAL   LONG TERM GOALS: Target date: 11/02/2023   Decrease pain by 75-80%  Baseline:  Goal status: INITIAL  2.  Increased core strength and LE strength to 5/5  Baseline:  Goal status: INITIAL  3.  Patient reports ability to walk 3-7 miles with no increase in pain  Baseline:  Goal status: INITIAL  4.  Patient reports ability to sleep through the night without awakening due to pain  Baseline:  Goal status: INITIAL  5.  Independent in advanced HEP  Baseline:  Goal status: INITIAL  6.  Improve Modified Oswestry Score by 10-15%  Baseline: 28/50; 56%  Goal status: INITIAL  PLAN:  PT FREQUENCY: 2x/week  PT DURATION: 8 weeks  PLANNED INTERVENTIONS: 97164- PT Re-evaluation, 97110-Therapeutic exercises, 97530- Therapeutic activity, 97112- Neuromuscular re-education, 97535- Self Care, [id number]- Manual therapy, [phone]- Aquatic Therapy, Patient/Family education, and Taping  PLAN FOR NEXT SESSION: review and progress exercises; continue with spine care and ergonomic education; manual work and modalities as indicated    [employer], PT 09/22/2023, 11:50 AM

## 2023-09-27 ENCOUNTER — Ambulatory Visit: Admitting: Rehabilitative and Restorative Service Providers"

## 2023-09-27 ENCOUNTER — Encounter: Payer: Self-pay | Admitting: Rehabilitative and Restorative Service Providers"

## 2023-09-27 DIAGNOSIS — G8929 Other chronic pain: Secondary | ICD-10-CM | POA: Diagnosis not present

## 2023-09-27 DIAGNOSIS — M545 Low back pain, unspecified: Secondary | ICD-10-CM | POA: Diagnosis not present

## 2023-09-27 DIAGNOSIS — J479 Bronchiectasis, uncomplicated: Secondary | ICD-10-CM | POA: Diagnosis not present

## 2023-09-27 DIAGNOSIS — R293 Abnormal posture: Secondary | ICD-10-CM | POA: Diagnosis not present

## 2023-09-27 DIAGNOSIS — M6281 Muscle weakness (generalized): Secondary | ICD-10-CM | POA: Diagnosis not present

## 2023-09-27 DIAGNOSIS — M5459 Other low back pain: Secondary | ICD-10-CM

## 2023-09-27 NOTE — Therapy (Signed)
 OUTPATIENT PHYSICAL THERAPY THORACOLUMBAR TREATMENT   Patient Name: Amber Norris MRN: [id number] DOB:04-05-1945, 78 y.o., female Today's Date: 09/27/2023  END OF SESSION:  PT End of Session - 09/27/23 1409     Visit Number 6    Number of Visits 16    Date for PT Re-Evaluation 11/02/23    Authorization Type [employer] medicare $20 copay    Authorization Time Period auth required    Progress Note Due on Visit 10    PT Start Time 1400    PT Stop Time 1445    PT Time Calculation (min) 45 min          Past Medical History:  Diagnosis Date   Hyperlipemia    Past Surgical History:  Procedure Laterality Date   ABDOMINAL HYSTERECTOMY     FOOT SURGERY     SHOULDER SURGERY     Patient Active Problem List   Diagnosis Date Noted   Bronchiectasis without complication (HCC) 06/23/2022   Former smoker 06/23/2022    PCP: Dr Velna JONELLE Skeeter  REFERRING PROVIDER: Dr Velna JONELLE Skeeter  REFERRING DIAG: Low back pain   Rationale for Evaluation and Treatment: Rehabilitation  THERAPY DIAG:  Other low back pain  Muscle weakness (generalized)  Abnormal posture  ONSET DATE: 05/09/23 (has been about 2 years since back pain started)   SUBJECTIVE:                                                                                                                                                                                           SUBJECTIVE STATEMENT: Patient reports that she has good and bad days. The heel lift is better than when she doesn't have the lift in. Wearing the lift when she knows she will be on her feet for any length of time. She has more pain when she is sitting than when she is standing or walking. Working on her exercises at home. Still finds herself crossing her legs when she sits.    Overall improving with less back pain. She is noticing that she can getting in and out of the car and in and out the bed sometimes but not always. Sometimes has to help lift L leg into bed or car  using hands. She is working on her exercises at home. She thinks the heel lift is helping. She wears it when she is out but is still in her flip flops when she is in her house. Still has the most trouble when she gets up in the morning. Sleeps on her side. Mostly on the L side. Has tried pillow between knees. She has stopped  sitting in her recliner and she is now sitting on the couch or love seat. Still sits in the recliner in the morning for her Bible reading usually about an hour.    Eval: Patient reports that she has had increased LBP since 4/25. She has had some LBP for several months or years on an intermittent basis. She was treated with medication and exercises with no significant improvement.   PERTINENT HISTORY:  Chronic LBP, osteoporosis; osteoarthritis; lung disease; lumbar fractures most recent ~ 2 yrs ago   PAIN:  Are you having pain? Yes: NPRS scale: 2/10 Pain location: L > R posterior hips and some in the low back  Pain description: dull ache - intermittent  Aggravating factors: sitting > 5-10 min; bending; lifting; reaching; dressing putting pants on  Relieving factors: standing; heating pad Pain: Pain is some better at times with transitional movements including getting in and out of her car; in and out of bed or sit to stand after she has been sitting for any length of time.  Heel Lift: Positive response to trial of 5/8's inch lift R shoe which improved pelvic alignment. Encouraged Dagoberto use lift in shoes when she is at home and not only when she is outside the home. She wears flip flops when she is at home. (Patient demonstrated leg length difference in standing with R PSIS and iliac crest lower than L without lift.)   PRECAUTIONS: Back - osteoporosis   WEIGHT BEARING RESTRICTIONS: No  FALLS:  Has patient fallen in last 6 months? 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  DIAGNOSTIC FINDINGS:  Xray lumbar spine: 06/22/23: Interpretation: There are mild compression fractures of the T12-L4 vertebral bodies. There is grade 1 retrolisthesis of L1 on L2 and L2 on L3. There is disc space loss that is most severe at L1-2 and L5-S1 and there is lower lumbar facet arthropathy. There is mild right convex curvature.   PATIENT SURVEYS:  Modified Oswestry: 28/50; 56%   SENSATION: WFL  MUSCLE LENGTH: Hamstrings: Right 70 deg; Left 70 deg Tight hip flexors Quads tight ~ 90 deg flex prone   POSTURE: rounded shoulders, forward head, increased lumbar lordosis, increased thoracic kyphosis, flexed trunk , and L LE/hemipelvis higher than R  PALPATION: Tender R hip flexors; L > R lumbar paraspinals; QL; lats along the crest of the pelvis   LUMBAR ROM:   AROM eval  Flexion 65% pain L LB  Extension 50% feels good   Right lateral flexion 85% pain R side  Left lateral flexion 90% pull no pain  Right rotation 50%  Left rotation 50%   (Blank rows = not tested)  LOWER EXTREMITY ROM:   tight end ranges bilat hips   Active  Right eval Left eval  Hip flexion    Hip extension    Hip abduction    Hip adduction    Hip internal rotation    Hip external rotation    Knee flexion(prone)  90  90   Knee  extension    Ankle dorsiflexion    Ankle plantarflexion    Ankle inversion    Ankle eversion     (Blank rows = not tested)  LOWER EXTREMITY MMT:    MMT Right eval Left eval  Hip flexion 5 5  Hip extension 4 4  Hip abduction 5 4+  Hip adduction     Hip internal rotation    Hip external rotation    Knee flexion    Knee extension    Ankle dorsiflexion    Ankle plantarflexion    Ankle inversion    Ankle eversion     (Blank rows = not tested)  LUMBAR SPECIAL TESTS:  Straight leg raise test: Negative and Slump test: Negative  FUNCTIONAL TESTS:  5 times sit to stand:  SLS R with UE support 10 sec; L 5 sec with LOB     GAIT: Distance walked: 40 feet  Assistive device utilized: None Level of assistance: Complete Independence Comments: fwd flexed posture  [facility] Adult PT Treatment:                                                DATE: 09/27/23 Therapeutic Exercise: Quad stretch 30 sec x 2 R/L (painful in prone tried in sitting - minimal stretch will hold for now)  Trunk rotation in hooklying 5 sec x 5 Rt/Lt Neuromuscular re-ed: Core activation transverse abdominals 10 sec x 10 added hiss with core with good improvement in core activation  Therapeutic Activity: Heel tap 90/90 core engaged x 10 with core engaged   Shoulder flexion red TB core engaged x 10  March alternating LE's 5 sec x 10 Rt/Lt Hip abduction with green TB alternating LE's 5 sec x 10 Rt/Lt  Hip abduction in sidelying hip extended leading with heel 3 sec x 10 R/L  Bridge 3 sec x 10 with hiss  Row green TB 3 sec x 10 x 3 Shoulder extension green TB 3 sec x 10  Self Care: Continue use of heel lift R shoe - encouraged patient to use shoes at home in which she can use the heel lift Encouraged continued modifications for sitting w/firmer support for back - avoid sitting with legs crossed  Sleeping positions - demonstrated and tried lying on L side pillow between knees and pillow to hug (improving)    [facility] Adult PT Treatment:                                                DATE: 09/22/23 Therapeutic Exercise: Quad stretch 30 sec x 2 R/L (painful in prone tried in sitting - minimal stretch will hold for now)  Trunk rotation in hooklying 5 sec x 5 Rt/Lt Neuromuscular  re-ed: Core activation transverse abdominals 10 sec x 10 added hiss with core with good improvement in core activation  Therapeutic Activity: Single knee to chest 10 sec x 5 R/L  Double knee to chest 10 sec x 5  Shoulder flexion red TB core engaged x 10  March alternating LE's 5 sec x 10 Rt/Lt Hip abduction with green TB alternating LE's 5 sec x 10 Rt/Lt  Clam sidelying red TB 3 sec x 10  Bridge 3 sec x 10 with hum  Row green TB 3 sec x 10 x 3 Shoulder extension green TB 3 sec x 10 Supine QL stretch 10-15 sec x 3   Seated lateral trunk flexion over bolster and pillow 20 sec x 2 to the R to stretch L QL  Self Care: Continue use of heel lift R shoe - encouraged patient to use shoes at home in which she can use the heel lift Encouraged continued modifications for sitting w/firmer support for back  Sleeping positions - demonstrated and tried lying on L side pillow between knees and pillow to hug    [facility] Adult PT Treatment:                                                DATE: 09/20/23 Therapeutic Exercise: Quad stretch 30 sec x 2 R/L (painful in prone tried in sitting - minimal stretch will hold for now)  Trunk rotation in hooklying 5 sec x 5 Rt/Lt Neuromuscular re-ed: Core activation transverse abdominals 10 sec x 10  Therapeutic Activity: Single knee to chest 10 sec x 5 R/L  Double knee to chest 10 sec x 5  Shoulder flexion red TB core engaged x 10  March alternating LE's 5 sec x 10 Rt/Lt Hip abduction with green TB alternating LE's 5 sec x 10 Rt/Lt  Bridge 3 sec x 10  Row green TB 3 sec x 10 x 3 Shoulder extension green TB 3 sec x 10  Self Care: Continue use of heel lift added to R shoe - encouraged patient to use shoes at home in which she can use the heel lift Encouraged continued modifications for sitting w/firmer support for back  Sleeping positions - demonstrated and tried lying on L side pillow between knees and pillow to hug   PATIENT EDUCATION:  Education details: POC;  HEP  Person educated: Patient Education method: Programmer, multimedia, Demonstration, Tactile cues, Verbal cues, and Handouts Education comprehension: verbalized understanding, returned demonstration, verbal cues required, tactile cues required, and needs further education  HOME EXERCISE PROGRAM: Access Code: [email or web address] URL: [email or web address] Date: 09/27/2023 Prepared by: Kaylenn Civil  Program Notes lying on back hips and knees bentbring knees toward chestturn hands toward face push hands over head hold 30 sec bring arms down then bring legs down   Exercises - Supine Transversus Abdominis Bracing with Pelvic Floor Contraction  - 2 x daily - 7 x weekly - 1 sets - 10 reps - 10sec  hold - Supine Shoulder Flexion Extension AAROM with Dowel  - 1 x daily - 7 x weekly - 1 sets - 10 reps - 2-3 sec  hold - Hooklying Single Knee to Chest Stretch  - 1 x daily - 7 x weekly - 1 sets - 3-5 reps - 10-15 sec  hold - Supine Double Knee to Chest  - 1 x daily - 7 x weekly - 1 sets - 3-5 reps - 10 sec  hold - Supine Lower Trunk Rotation  - 1 x daily - 7 x weekly - 1 sets - 3-5 reps - 15-20 sec  hold - Supine March  - 1 x daily - 7 x weekly - 1-2 sets - 3 reps - 2 sec  hold - Hooklying Isometric Clamshell  - 1 x daily - 7 x weekly - 1 sets - 10 reps - 3 sec  hold - Supine Bridge  -  1 x daily - 7 x weekly - 1-2 sets - 10 reps - 3-5 sec  hold - Standing Bilateral Low Shoulder Row with Anchored Resistance  - 2 x daily - 7 x weekly - 1-3 sets - 10 reps - 2-3 sec  hold - Shoulder extension with resistance - Neutral  - 1 x daily - 7 x weekly - 1-2 sets - 10 reps - 3-5 sec  hold - Supine Quadratus Lumborum Stretch  - 1-2 x daily - 7 x weekly - 1 sets - 3 reps - 10-15 sec  hold - Clamshell with Resistance  - 2 x daily - 7 x weekly - 2 sets - 10 reps - 3 sec  hold - Seated Quadratus Lumborum Stretch in Chair  - 2 x daily - 7 x weekly - 1 sets - 3 reps - 20-30 sec  hold - Sidelying Hip Abduction  - 1 x daily - 7 x  weekly - 3 sets - 10 reps - 3-5 sec  hold  ASSESSMENT:  CLINICAL IMPRESSION: Patient reports that she has continued LBP at times worse than others. Patient has progressed with certain activities but has difficulty with tightening core muscles for exercises and functional activities. Trial of hiss with core activation with exercises to improve in exercise technique.  Continued exercises working on core stabilization. Continued education re-importance of core activation with resolving LBP.     Eval: Patient is a 78 y.o. female who was seen today for physical therapy evaluation and treatment for low back pain. She has a history of recurrent LBP with degenerative changes in the lumbar spine. Patient presents today with pain in the L > R LB and posterior hip area. She has poor posture and alignment; limited trunk and LE mobility/ROM/strength; poor core strength for transitional movements and transfers. She has palpable tightness in L > R hip flexors, R lumbar paraspinals; lats/lumbar paraspinals. She has pain with functional activities. Patient will benefit from PT to address problems identified.    GOALS: Goals reviewed with patient? Yes  SHORT TERM GOALS: Target date: 10/05/2023   Independent in initial HEP Baseline: Goal status: INITIAL  2.  Patient will demonstrate and verbalize improved/proper transfers and transitional movements  Baseline:  Goal status: INITIAL   LONG TERM GOALS: Target date: 11/02/2023   Decrease pain by 75-80%  Baseline:  Goal status: INITIAL  2.  Increased core strength and LE strength to 5/5  Baseline:  Goal status: INITIAL  3.  Patient reports ability to walk 3-7 miles with no increase in pain  Baseline:  Goal status: INITIAL  4.  Patient reports ability to sleep through the night without awakening due to pain  Baseline:  Goal status: INITIAL  5.  Independent in advanced HEP  Baseline:  Goal status: INITIAL  6.  Improve Modified Oswestry Score by  10-15%  Baseline: 28/50; 56%  Goal status: INITIAL  PLAN:  PT FREQUENCY: 2x/week  PT DURATION: 8 weeks  PLANNED INTERVENTIONS: 97164- PT Re-evaluation, 97110-Therapeutic exercises, 97530- Therapeutic activity, 97112- Neuromuscular re-education, 97535- Self Care, [id number]- Manual therapy, [phone]- Aquatic Therapy, Patient/Family education, and Taping  PLAN FOR NEXT SESSION: review and progress exercises; continue with spine care and ergonomic education; manual work and modalities as indicated    [employer], PT 09/27/2023, 2:10 PM

## 2023-09-29 ENCOUNTER — Ambulatory Visit: Admitting: Rehabilitative and Restorative Service Providers"

## 2023-09-29 ENCOUNTER — Encounter: Payer: Self-pay | Admitting: Rehabilitative and Restorative Service Providers"

## 2023-09-29 DIAGNOSIS — M5459 Other low back pain: Secondary | ICD-10-CM

## 2023-09-29 DIAGNOSIS — M6281 Muscle weakness (generalized): Secondary | ICD-10-CM | POA: Diagnosis not present

## 2023-09-29 DIAGNOSIS — R293 Abnormal posture: Secondary | ICD-10-CM | POA: Diagnosis not present

## 2023-09-29 DIAGNOSIS — J479 Bronchiectasis, uncomplicated: Secondary | ICD-10-CM | POA: Diagnosis not present

## 2023-09-29 DIAGNOSIS — M545 Low back pain, unspecified: Secondary | ICD-10-CM | POA: Diagnosis not present

## 2023-09-29 DIAGNOSIS — G8929 Other chronic pain: Secondary | ICD-10-CM | POA: Diagnosis not present

## 2023-09-29 NOTE — Therapy (Signed)
 OUTPATIENT PHYSICAL THERAPY THORACOLUMBAR TREATMENT   Patient Name: Amber Norris MRN: [id number] DOB:11/06/1945, 78 y.o., female Today's Date: 09/29/2023  END OF SESSION:  PT End of Session - 09/29/23 1358     Visit Number 7    Number of Visits 16    Date for PT Re-Evaluation 11/02/23    Authorization Type [employer] medicare $20 copay    Authorization Time Period 16 visits approved 09/13/23-11/02/23    Progress Note Due on Visit 10    PT Start Time 1356    PT Stop Time 1443    PT Time Calculation (min) 47 min    Activity Tolerance Patient tolerated treatment well          Past Medical History:  Diagnosis Date   Hyperlipemia    Past Surgical History:  Procedure Laterality Date   ABDOMINAL HYSTERECTOMY     FOOT SURGERY     SHOULDER SURGERY     Patient Active Problem List   Diagnosis Date Noted   Bronchiectasis without complication (HCC) 06/23/2022   Former smoker 06/23/2022    PCP: Dr Velna JONELLE Skeeter  REFERRING PROVIDER: Dr Velna JONELLE Skeeter  REFERRING DIAG: Low back pain   Rationale for Evaluation and Treatment: Rehabilitation  THERAPY DIAG:  Other low back pain  Muscle weakness (generalized)  Abnormal posture  ONSET DATE: 05/09/23 (has been about 2 years since back pain started)   SUBJECTIVE:                                                                                                                                                                                           SUBJECTIVE STATEMENT: Patient reports that she has a pretty good day today. Not having as much pain overall. Getting in and out of the floor more easily for her exercises now. The heel lift is better than when she doesn't have the lift in. Wearing the lift when she knows she will be on her feet for any length of time. She has more pain when she is sitting than when she is standing or walking. Working on her exercises at home. Trying not to cross her legs when she sits.    Overall improving with  less back pain. She is noticing that she can getting in and out of the car and in and out the bed sometimes but not always. Sometimes has to help lift L leg into bed or car using hands. She is working on her exercises at home. She thinks the heel lift is helping. She wears it when she is out but is still in her flip flops when she is in  her house. Still has the most trouble when she gets up in the morning. Sleeps on her side. Mostly on the L side. Has tried pillow between knees. She has stopped sitting in her recliner and she is now sitting on the couch or love seat. Still sits in the recliner in the morning for her Bible reading usually about an hour.    Eval: Patient reports that she has had increased LBP since 4/25. She has had some LBP for several months or years on an intermittent basis. She was treated with medication and exercises with no significant improvement.   PERTINENT HISTORY:  Chronic LBP, osteoporosis; osteoarthritis; lung disease; lumbar fractures most recent ~ 2 yrs ago   PAIN:  Are you having pain? Yes: NPRS scale: 0/10 Pain location: L > R posterior hips and some in the low back  Pain description: dull ache - intermittent  Aggravating factors: sitting > 5-10 min; bending; lifting; reaching; dressing putting pants on  Relieving factors: standing; heating pad Pain: Pain is some better at times with transitional movements including getting in and out of her car; in and out of bed or sit to stand after she has been sitting for any length of time.  Heel Lift: Positive response to trial of 5/8's inch lift R shoe which improved pelvic alignment. Encouraged Dagoberto use lift in shoes when she is at home and not only when she is outside the home. She wears flip flops when she is at home. (Patient demonstrated leg length difference in standing with R PSIS and iliac crest lower than L without lift.)   PRECAUTIONS: Back - osteoporosis   WEIGHT BEARING RESTRICTIONS: No  FALLS:  Has patient  fallen in last 6 months? 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  DIAGNOSTIC FINDINGS:  Xray lumbar spine: 06/22/23: Interpretation: There are mild compression fractures of the T12-L4 vertebral bodies. There is grade 1 retrolisthesis of L1 on L2 and L2 on L3. There is disc space loss that is most severe at L1-2 and L5-S1 and there is lower lumbar facet arthropathy. There is mild right convex curvature.   PATIENT SURVEYS:  Modified Oswestry: 28/50; 56%   SENSATION: WFL  MUSCLE LENGTH: Hamstrings: Right 70 deg; Left 70 deg Tight hip flexors Quads tight ~ 90 deg flex prone   POSTURE: rounded shoulders, forward head, increased lumbar lordosis, increased thoracic kyphosis, flexed trunk , and L LE/hemipelvis higher than R  PALPATION: Tender R hip flexors; L > R lumbar paraspinals; QL; lats along the crest of the pelvis   LUMBAR ROM:   AROM eval  Flexion 65% pain L LB  Extension 50% feels good   Right lateral flexion 85% pain R side  Left lateral flexion 90% pull no pain  Right rotation 50%  Left rotation 50%   (Blank rows = not tested)  LOWER EXTREMITY ROM:   tight end ranges bilat hips   Active  Right eval Left eval  Hip flexion    Hip extension  Hip abduction    Hip adduction    Hip internal rotation    Hip external rotation    Knee flexion(prone)  90  90   Knee extension    Ankle dorsiflexion    Ankle plantarflexion    Ankle inversion    Ankle  eversion     (Blank rows = not tested)  LOWER EXTREMITY MMT:    MMT Right eval Left eval  Hip flexion 5 5  Hip extension 4 4  Hip abduction 5 4+  Hip adduction    Hip internal rotation    Hip external rotation    Knee flexion    Knee extension    Ankle dorsiflexion    Ankle plantarflexion    Ankle inversion    Ankle eversion     (Blank rows = not tested)  LUMBAR SPECIAL TESTS:  Straight leg raise test: Negative and Slump test: Negative  FUNCTIONAL TESTS:  5 times sit to stand:  SLS R with UE support 10 sec; L 5 sec with LOB     GAIT: Distance walked: 40 feet  Assistive device utilized: None Level of assistance: Complete Independence Comments: fwd flexed posture  [facility] Adult PT Treatment:                                                DATE: 09/29/23 Therapeutic Exercise: Trunk rotation in hooklying 5 sec x 5 Rt/Lt Neuromuscular re-ed: Core activation transverse abdominals 10 sec x 10 added hiss with core with good improvement in core activation  Therapeutic Activity: Heel tap 90/90 core engaged x 10 with core engaged   Shoulder flexion green TB core engaged x 10  March alternating LE's 5 sec x 10 Rt/Lt Hip abduction with blue TB alternating LE's 5 sec x 10 Rt/Lt  Bridge 3 sec x 10 with hiss  Hip abduction side steps blue TB above knees 8 feet x 5 R/L  Row blue TB 3 sec x 10 x 3 Shoulder extension blue TB 3 sec x 10 Antirotation blue 1 strip 3 sec x 10 R/L  Self Care: Continue use of heel lift R shoe - encouraged patient to use shoes at home in which she can use the heel lift Encouraged continued modifications for sitting w/firmer support for back - avoid sitting with legs crossed  Sleeping positions - demonstrated and tried lying on L side pillow between knees and pillow to hug (improving)    [facility] Adult PT Treatment:                                                DATE: 09/27/23 Therapeutic Exercise: Quad stretch 30 sec x 2 R/L (painful in prone tried in sitting  - minimal stretch will hold for now)  Trunk rotation in hooklying 5 sec x 5 Rt/Lt Neuromuscular re-ed: Core activation transverse abdominals 10 sec x 10 added hiss with core with good improvement in core activation  Therapeutic Activity: Heel tap 90/90 core engaged x 10 with core engaged   Shoulder flexion red TB core engaged x 10  March alternating LE's 5 sec x 10 Rt/Lt Hip abduction with green TB alternating LE's 5 sec x 10 Rt/Lt  Hip abduction in sidelying hip extended  leading with heel 3 sec x 10 R/L  Bridge 3 sec x 10 with hiss  Row green TB 3 sec x 10 x 3 Shoulder extension green TB 3 sec x 10  Self Care: Continue use of heel lift R shoe - encouraged patient to use shoes at home in which she can use the heel lift Encouraged continued modifications for sitting w/firmer support for back - avoid sitting with legs crossed  Sleeping positions - demonstrated and tried lying on L side pillow between knees and pillow to hug (improving)    [facility] Adult PT Treatment:                                                DATE: 09/22/23 Therapeutic Exercise: Quad stretch 30 sec x 2 R/L (painful in prone tried in sitting - minimal stretch will hold for now)  Trunk rotation in hooklying 5 sec x 5 Rt/Lt Neuromuscular re-ed: Core activation transverse abdominals 10 sec x 10 added hiss with core with good improvement in core activation  Therapeutic Activity: Single knee to chest 10 sec x 5 R/L  Double knee to chest 10 sec x 5  Shoulder flexion red TB core engaged x 10  March alternating LE's 5 sec x 10 Rt/Lt Hip abduction with green TB alternating LE's 5 sec x 10 Rt/Lt  Clam sidelying red TB 3 sec x 10  Bridge 3 sec x 10 with hum  Row green TB 3 sec x 10 x 3 Shoulder extension green TB 3 sec x 10 Supine QL stretch 10-15 sec x 3   Seated lateral trunk flexion over bolster and pillow 20 sec x 2 to the R to stretch L QL  Self Care: Continue use of heel lift R shoe - encouraged patient to use shoes at  home in which she can use the heel lift Encouraged continued modifications for sitting w/firmer support for back  Sleeping positions - demonstrated and tried lying on L side pillow between knees and pillow to hug   PATIENT EDUCATION:  Education details: POC; HEP  Person educated: Patient Education method: Programmer, multimedia, Demonstration, Tactile cues, Verbal cues, and Handouts Education comprehension: verbalized understanding, returned demonstration, verbal cues required, tactile cues required, and needs further education  HOME EXERCISE PROGRAM: Access Code: [email or web address] URL: [email or web address] Date: 09/29/2023 Prepared by: Gerrad Welker  Program Notes lying on back hips and knees bentbring knees toward chestturn hands toward face push hands over head hold 30 sec bring arms down then bring legs down   Exercises - Supine Transversus Abdominis Bracing with Pelvic Floor Contraction  - 2 x daily - 7 x weekly - 1 sets - 10 reps - 10sec  hold - Supine Shoulder Flexion Extension AAROM with Dowel  - 1 x daily - 7 x weekly - 1 sets - 10 reps - 2-3 sec  hold - Hooklying Single Knee to Chest Stretch  - 1 x daily - 7 x weekly - 1 sets - 3-5 reps - 10-15 sec  hold - Supine Double Knee to Chest  - 1 x daily - 7 x weekly - 1 sets - 3-5 reps - 10 sec  hold - Supine Lower Trunk Rotation  - 1 x daily - 7 x weekly - 1 sets - 3-5 reps - 15-20 sec  hold - Supine March  - 1 x daily -  7 x weekly - 1-2 sets - 3 reps - 2 sec  hold - Hooklying Isometric Clamshell  - 1 x daily - 7 x weekly - 1 sets - 10 reps - 3 sec  hold - Supine Bridge  - 1 x daily - 7 x weekly - 1-2 sets - 10 reps - 3-5 sec  hold - Standing Bilateral Low Shoulder Row with Anchored Resistance  - 2 x daily - 7 x weekly - 1-3 sets - 10 reps - 2-3 sec  hold - Shoulder extension with resistance - Neutral  - 1 x daily - 7 x weekly - 1-2 sets - 10 reps - 3-5 sec  hold - Supine Quadratus Lumborum Stretch  - 1-2 x daily - 7 x weekly - 1 sets - 3  reps - 10-15 sec  hold - Clamshell with Resistance  - 2 x daily - 7 x weekly - 2 sets - 10 reps - 3 sec  hold - Seated Quadratus Lumborum Stretch in Chair  - 2 x daily - 7 x weekly - 1 sets - 3 reps - 20-30 sec  hold - Sidelying Hip Abduction  - 1 x daily - 7 x weekly - 3 sets - 10 reps - 3-5 sec  hold - Side Stepping with Resistance at Thighs  - 1 x daily - 7 x weekly - Anti-Rotation Lateral Stepping with Press  - 1 x daily - 7 x weekly - 1-2 sets - 10 reps - 2-3 sec  hold  ASSESSMENT:  CLINICAL IMPRESSION: Patient reports that she has noticed decreased LBP in the past few days. She has less difficulty with tightening core muscles for exercises and functional activities and is working on this for home. Trial of hiss with core activation with exercises improves exercise technique.  Continued exercises working on core stabilization. Continued education re-importance of core activation with resolving LBP. Try counter plank.     Eval: Patient is a 78 y.o. female who was seen today for physical therapy evaluation and treatment for low back pain. She has a history of recurrent LBP with degenerative changes in the lumbar spine. Patient presents today with pain in the L > R LB and posterior hip area. She has poor posture and alignment; limited trunk and LE mobility/ROM/strength; poor core strength for transitional movements and transfers. She has palpable tightness in L > R hip flexors, R lumbar paraspinals; lats/lumbar paraspinals. She has pain with functional activities. Patient will benefit from PT to address problems identified.    GOALS: Goals reviewed with patient? Yes  SHORT TERM GOALS: Target date: 10/05/2023   Independent in initial HEP Baseline: Goal status: INITIAL  2.  Patient will demonstrate and verbalize improved/proper transfers and transitional movements  Baseline:  Goal status: INITIAL   LONG TERM GOALS: Target date: 11/02/2023   Decrease pain by 75-80%  Baseline:  Goal  status: INITIAL  2.  Increased core strength and LE strength to 5/5  Baseline:  Goal status: INITIAL  3.  Patient reports ability to walk 3-7 miles with no increase in pain  Baseline:  Goal status: INITIAL  4.  Patient reports ability to sleep through the night without awakening due to pain  Baseline:  Goal status: INITIAL  5.  Independent in advanced HEP  Baseline:  Goal status: INITIAL  6.  Improve Modified Oswestry Score by 10-15%  Baseline: 28/50; 56%  Goal status: INITIAL  PLAN:  PT FREQUENCY: 2x/week  PT DURATION: 8 weeks  PLANNED  INTERVENTIONS: 97164- PT Re-evaluation, 97110-Therapeutic exercises, 97530- Therapeutic activity, [id number]- Neuromuscular re-education, [phone]- Self Care, [phone]- Manual therapy, [phone]- Aquatic Therapy, Patient/Family education, and Taping  PLAN FOR NEXT SESSION: review and progress exercises; continue with spine care and ergonomic education; manual work and modalities as indicated    [employer], PT 09/29/2023, 1:58 PM

## 2023-09-30 ENCOUNTER — Ambulatory Visit (HOSPITAL_BASED_OUTPATIENT_CLINIC_OR_DEPARTMENT_OTHER): Admitting: Pulmonary Disease

## 2023-09-30 ENCOUNTER — Encounter (HOSPITAL_BASED_OUTPATIENT_CLINIC_OR_DEPARTMENT_OTHER): Payer: Self-pay | Admitting: Pulmonary Disease

## 2023-09-30 VITALS — BP 142/82 | HR 81 | Wt 130.0 lb

## 2023-09-30 DIAGNOSIS — Z8701 Personal history of pneumonia (recurrent): Secondary | ICD-10-CM | POA: Diagnosis not present

## 2023-09-30 DIAGNOSIS — Z8616 Personal history of COVID-19: Secondary | ICD-10-CM

## 2023-09-30 DIAGNOSIS — Z87891 Personal history of nicotine dependence: Secondary | ICD-10-CM | POA: Diagnosis not present

## 2023-09-30 DIAGNOSIS — M810 Age-related osteoporosis without current pathological fracture: Secondary | ICD-10-CM | POA: Diagnosis not present

## 2023-09-30 DIAGNOSIS — J479 Bronchiectasis, uncomplicated: Secondary | ICD-10-CM | POA: Diagnosis not present

## 2023-09-30 NOTE — Patient Instructions (Addendum)
 X Hi Res CT chest  X Decrease albuterol  nebs to as needed Flutter valve once daily  X OK to use stiolto as needed

## 2023-09-30 NOTE — Progress Notes (Signed)
 Subjective:    Patient ID: Amber Norris, female    DOB: 1945-09-07, 78 y.o.   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  - colitis, on Entocort EC.  -compression fractures at T7, T12, and L1,    Significant tests/ events reviewed  AFB sputum culture February 2024 negative CT chest December 2023 patchy airspace opacity in the upper lobes bilaterally and right middle lobe, tree-in-bud nodular opacities, moderate to large hiatal hernia   CT chest January 2024 biapical scarring, bronchiectatic changes in the lingula, dense opacities in the lingula and right middle lobe have markedly improved   CT chest July 2024 showed improved consolidation in the lingula, residual scarring in the lingula, right middle lobe and peripherally in the lower lobe lung bases.  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   08/12/2022 PFT normal , DLCO 77%.  Discussed the use of AI scribe software for clinical note transcription with the patient, who gave verbal consent to proceed.  History of Present Illness Amber Norris is a 78 year old female with mild to moderate bronchiectasis who presents for follow-up.  She experiences severe cramps, particularly at night, which she associates with her nebulizer and Stiolto use. These cramps disrupt her sleep, requiring her to get up multiple times a night. She has reduced nebulizer use due to the cramps and believes albuterol  is the main cause. She considers discontinuing it unless wheezing occurs. Stiolto is taken every morning and is helpful, but she is concerned about cramps and potential hoarseness. Reducing nebulizer use has alleviated the cramps.  She uses a flutter valve, which she finds easy to clean and effective. She has a history of pneumonia and COVID-19, making her cautious about mucus buildup in her airways. She has not had significant  sputum production recently.  She is concerned about potential fumes from installing vinyl plank flooring in her home due to her lung condition and plans to check the materials before installation.       Review of Systems  neg for any significant sore throat, dysphagia, itching, sneezing, nasal congestion or excess/ purulent secretions, fever, chills, sweats, unintended wt loss, pleuritic or exertional cp, hempoptysis, orthopnea pnd or change in chronic leg swelling. Also denies presyncope, palpitations, heartburn, abdominal pain, nausea, vomiting, diarrhea or change in bowel or urinary habits, dysuria,hematuria, rash, arthralgias, visual complaints, headache, numbness weakness or ataxia.      Objective:   Physical Exam Gen. Pleasant, well-nourished, in no distress ENT - no thrush, no pallor/icterus,no post nasal drip Neck: No JVD, no thyromegaly, no carotid bruits Lungs: no use of accessory muscles, no dullness to percussion, clear without rales or rhonchi  Cardiovascular: Rhythm regular, heart sounds  normal, no murmurs or gallops, no peripheral edema Musculoskeletal: No deformities, no cyanosis or clubbing         Assessment & Plan:   Assessment and Plan Assessment & Plan Bronchiectasis Chronic bronchiectasis with mild to moderate severity, recent exacerbations with cramps and wheezing likely related to medication use. Lung function is normal. No significant sputum production, but history of pneumonia and COVID-19. Consider MAC infection if symptoms worsen. - Order repeat hi res chest CT for comparison with last year. - decrease albuterol  nebulizer as needed for wheezing or colds. - decrease Stiolto as needed, considering normal lung function. - Use flutter valve once daily when well, up  to 3-4 times daily if phlegm is present. - Educate on keeping airways clear, especially during colds or bronchitis. - Monitor for yellow-green phlegm and consider antibiotics if present. -  Discuss potential side effects of Stiolto, including hoarseness and increased heart rate. -repeat sp for afb if ever productive  Osteoporosis Osteoporosis management with consideration of calcium and vitamin D supplementation. Concerns about medication side effects and reluctance to start injections. - Continue calcium and vitamin D supplementation. - Discuss osteoporosis treatment options with primary care provider, including potential injections.  Goals of Care Discussion about home environment and potential risks related to flooring installation. - Advise to be away from home during flooring installation to avoid fumes. - Suggest checking flooring materials for fumes before purchase.

## 2023-10-04 ENCOUNTER — Encounter: Payer: Self-pay | Admitting: Rehabilitative and Restorative Service Providers"

## 2023-10-04 ENCOUNTER — Ambulatory Visit: Admitting: Rehabilitative and Restorative Service Providers"

## 2023-10-04 DIAGNOSIS — G8929 Other chronic pain: Secondary | ICD-10-CM | POA: Diagnosis not present

## 2023-10-04 DIAGNOSIS — M5459 Other low back pain: Secondary | ICD-10-CM

## 2023-10-04 DIAGNOSIS — M6281 Muscle weakness (generalized): Secondary | ICD-10-CM

## 2023-10-04 DIAGNOSIS — R293 Abnormal posture: Secondary | ICD-10-CM | POA: Diagnosis not present

## 2023-10-04 DIAGNOSIS — M545 Low back pain, unspecified: Secondary | ICD-10-CM | POA: Diagnosis not present

## 2023-10-04 DIAGNOSIS — J479 Bronchiectasis, uncomplicated: Secondary | ICD-10-CM | POA: Diagnosis not present

## 2023-10-04 NOTE — Therapy (Signed)
 OUTPATIENT PHYSICAL THERAPY THORACOLUMBAR TREATMENT   Patient Name: Amber Norris MRN: [id number] DOB:[birth year], 78 y.o., female Today's Date: 10/04/2023  END OF SESSION:  PT End of Session - 10/04/23 1400     Visit Number 8    Number of Visits 16    Date for PT Re-Evaluation 11/02/23    Authorization Time Period 16 visits approved 09/13/23-11/02/23    Progress Note Due on Visit 10    PT Start Time 1400    PT Stop Time 1445    PT Time Calculation (min) 45 min          Past Medical History:  Diagnosis Date   Hyperlipemia    Past Surgical History:  Procedure Laterality Date   ABDOMINAL HYSTERECTOMY     FOOT SURGERY     SHOULDER SURGERY     Patient Active Problem List   Diagnosis Date Noted   Bronchiectasis without complication (HCC) 06/23/2022   Former smoker 06/23/2022    PCP: Dr Velna JONELLE Skeeter  REFERRING PROVIDER: Dr Velna JONELLE Skeeter  REFERRING DIAG: Low back pain   Rationale for Evaluation and Treatment: Rehabilitation  THERAPY DIAG:  Other low back pain  Muscle weakness (generalized)  Abnormal posture  ONSET DATE: 05/09/23 (has been about 2 years since back pain started)   SUBJECTIVE:                                                                                                                                                                                           SUBJECTIVE STATEMENT: Patient reports that she is doing better. She is sleeping through the night without waking due to pain. She is getting in the car - easier to get the L leg into the car. Pain is decreased. Still has some trouble when she sits in the recliner. Getting in and out of the floor more easily for her exercises now. The heel lift is better than when she doesn't have the lift in. Wearing the lift when she knows she will be on her feet for any length of time.  Working on her exercises at home. Trying not to cross her legs when she sits.    Overall improving with less back pain. She  is noticing that she can getting in and out of the car and in and out the bed sometimes but not always. Sometimes has to help lift L leg into bed or car using hands. She is working on her exercises at home. She thinks the heel lift is helping. She wears it when she is out but is still in her flip flops when she is in her  house. Still has the most trouble when she gets up in the morning. Sleeps on her side. Mostly on the L side. Has tried pillow between knees. She has stopped sitting in her recliner and she is now sitting on the couch or love seat. Still sits in the recliner in the morning for her Bible reading usually about an hour.    Eval: Patient reports that she has had increased LBP since 4/25. She has had some LBP for several months or years on an intermittent basis. She was treated with medication and exercises with no significant improvement.   PERTINENT HISTORY:  Chronic LBP, osteoporosis; osteoarthritis; lung disease; lumbar fractures most recent ~ 2 yrs ago   PAIN:  Are you having pain? Yes: NPRS scale: 0/10 Pain location: L > R posterior hips and some in the low back  Pain description: dull ache - intermittent  Aggravating factors: sitting > 5-10 min; bending; lifting; reaching; dressing putting pants on  Relieving factors: standing; heating pad Pain: Pain is some better at times with transitional movements including getting in and out of her car; in and out of bed or sit to stand after she has been sitting for any length of time.  Heel Lift: Positive response to trial of 5/8's inch lift R shoe which improved pelvic alignment. Encouraged Dagoberto use lift in shoes when she is at home and not only when she is outside the home. She wears flip flops when she is at home. (Patient demonstrated leg length difference in standing with R PSIS and iliac crest lower than L without lift.)   PRECAUTIONS: Back - osteoporosis   WEIGHT BEARING RESTRICTIONS: No  FALLS:  Has patient fallen in last 6  months? 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  DIAGNOSTIC FINDINGS:  Xray lumbar spine: 06/22/23: Interpretation: There are mild compression fractures of the T12-L4 vertebral bodies. There is grade 1 retrolisthesis of L1 on L2 and L2 on L3. There is disc space loss that is most severe at L1-2 and L5-S1 and there is lower lumbar facet arthropathy. There is mild right convex curvature.   PATIENT SURVEYS:  Modified Oswestry: 28/50; 56%   SENSATION: WFL  MUSCLE LENGTH: Hamstrings: Right 70 deg; Left 70 deg Tight hip flexors Quads tight ~ 90 deg flex prone   POSTURE: rounded shoulders, forward head, increased lumbar lordosis, increased thoracic kyphosis, flexed trunk , and L LE/hemipelvis higher than R  PALPATION: Tender R hip flexors; L > R lumbar paraspinals; QL; lats along the crest of the pelvis   LUMBAR ROM:   AROM eval  Flexion 65% pain L LB  Extension 50% feels good   Right lateral flexion 85% pain R side  Left lateral flexion 90% pull no pain  Right rotation 50%  Left rotation 50%   (Blank rows = not tested)  LOWER EXTREMITY ROM:   tight end ranges bilat hips   Active  Right eval Left eval  Hip flexion    Hip extension    Hip  abduction    Hip adduction    Hip internal rotation    Hip external rotation    Knee flexion(prone)  90  90   Knee extension    Ankle dorsiflexion    Ankle plantarflexion    Ankle inversion    Ankle eversion     (Blank  rows = not tested)  LOWER EXTREMITY MMT:    MMT Right eval Left eval  Hip flexion 5 5  Hip extension 4 4  Hip abduction 5 4+  Hip adduction    Hip internal rotation    Hip external rotation    Knee flexion    Knee extension    Ankle dorsiflexion    Ankle plantarflexion    Ankle inversion    Ankle eversion     (Blank rows = not tested)  LUMBAR SPECIAL TESTS:  Straight leg raise test: Negative and Slump test: Negative  FUNCTIONAL TESTS:  5 times sit to stand:  SLS R with UE support 10 sec; L 5 sec with LOB     GAIT: Distance walked: 40 feet  Assistive device utilized: None Level of assistance: Complete Independence Comments: fwd flexed posture   [facility] Adult PT Treatment:                                                DATE: 10/04/23 Therapeutic Exercise: Knee to chest - single knee x 2 R/L Double knee to chest x 2  Neuromuscular re-ed: Core activation transverse abdominals 10 sec x 10 added hiss with core with good improvement in core activation  Therapeutic Activity: Shoulder flexion supine green TB core engaged x 10  March alternating LE's 5 sec x 10 Rt/Lt Hip abduction with blue TB alternating LE's 5 sec x 10 Rt/Lt  Bridge 3 sec x 10 with hiss  Hip abduction side steps blue TB above knees 8 feet x 5 R/L  Row blue TB 3 sec x 10 x 3 Shoulder extension blue TB 3 sec x 10 Antirotation blue 1 strip 3 sec x 10 R/L Counter plank 30 sec x 3 Sit to stand with hinged hip core engaged 5 x 2   Self Care: Continue use of heel lift R shoe - encouraged patient to use shoes at home in which she can use the heel lift Encouraged continued modifications for sitting w/firmer support for back - avoid sitting with legs crossed  Sleeping positions - demonstrated and tried lying on L side pillow between knees and pillow to hug (improving)   [facility] Adult PT Treatment:                                                DATE: 09/29/23 Therapeutic Exercise: Trunk rotation in hooklying 5 sec x  5 Rt/Lt Neuromuscular re-ed: Core activation transverse abdominals 10 sec x 10 added hiss with core with good improvement in core activation  Therapeutic Activity: Heel tap 90/90 core engaged x 10 with core engaged   Shoulder flexion green TB core engaged x 10  March alternating LE's 5 sec x 10 Rt/Lt Hip abduction with blue TB alternating LE's 5 sec x 10 Rt/Lt  Bridge 3 sec x 10 with hiss  Hip abduction side steps blue TB  above knees 8 feet x 5 R/L  Row blue TB 3 sec x 10 x 3 Shoulder extension blue TB 3 sec x 10 Antirotation blue 1 strip 3 sec x 10 R/L  Self Care: Continue use of heel lift R shoe - encouraged patient to use shoes at home in which she can use the heel lift Encouraged continued modifications for sitting w/firmer support for back - avoid sitting with legs crossed  Sleeping positions - demonstrated and tried lying on L side pillow between knees and pillow to hug (improving)    [facility] Adult PT Treatment:                                                DATE: 09/27/23 Therapeutic Exercise: Quad stretch 30 sec x 2 R/L (painful in prone tried in sitting - minimal stretch will hold for now)  Trunk rotation in hooklying 5 sec x 5 Rt/Lt Neuromuscular re-ed: Core activation transverse abdominals 10 sec x 10 added hiss with core with good improvement in core activation  Therapeutic Activity: Heel tap 90/90 core engaged x 10 with core engaged   Shoulder flexion red TB core engaged x 10  March alternating LE's 5 sec x 10 Rt/Lt Hip abduction with green TB alternating LE's 5 sec x 10 Rt/Lt  Hip abduction in sidelying hip extended leading with heel 3 sec x 10 R/L  Bridge 3 sec x 10 with hiss  Row green TB 3 sec x 10 x 3 Shoulder extension green TB 3 sec x 10  Self Care: Continue use of heel lift R shoe - encouraged patient to use shoes at home in which she can use the heel lift Encouraged continued modifications for sitting w/firmer support for back - avoid sitting with legs crossed   Sleeping positions - demonstrated and tried lying on L side pillow between knees and pillow to hug (improving)    PATIENT EDUCATION:  Education details: POC; HEP  Person educated: Patient Education method: Explanation, Demonstration, Tactile cues, Verbal cues, and Handouts Education comprehension: verbalized understanding, returned demonstration, verbal cues required, tactile cues required, and needs further education  HOME EXERCISE PROGRAM: Access Code: [email or web address] URL: [email or web address] Date: 10/04/2023 Prepared by: Javontae Marlette  Program Notes lying on back hips and knees bentbring knees toward chestturn hands toward face push hands over head hold 30 sec bring arms down then bring legs down   Exercises - Supine Transversus Abdominis Bracing with Pelvic Floor Contraction  - 2 x daily - 7 x weekly - 1 sets - 10 reps - 10sec  hold - Supine Shoulder Flexion Extension AAROM with Dowel  - 1 x daily - 7 x weekly - 1 sets - 10 reps - 2-3 sec  hold - Hooklying Single Knee to Chest Stretch  - 1 x daily - 7 x weekly - 1 sets - 3-5 reps - 10-15 sec  hold - Supine Double Knee to Chest  - 1 x daily - 7 x weekly - 1 sets - 3-5 reps - 10 sec  hold - Supine Lower Trunk Rotation  - 1 x daily - 7 x weekly - 1 sets - 3-5 reps - 15-20 sec  hold - Supine March  - 1 x daily - 7 x weekly - 1-2 sets - 3 reps - 2 sec  hold - Hooklying Isometric Clamshell  - 1  x daily - 7 x weekly - 1 sets - 10 reps - 3 sec  hold - Supine Bridge  - 1 x daily - 7 x weekly - 1-2 sets - 10 reps - 3-5 sec  hold - Standing Bilateral Low Shoulder Row with Anchored Resistance  - 2 x daily - 7 x weekly - 1-3 sets - 10 reps - 2-3 sec  hold - Shoulder extension with resistance - Neutral  - 1 x daily - 7 x weekly - 1-2 sets - 10 reps - 3-5 sec  hold - Supine Quadratus Lumborum Stretch  - 1-2 x daily - 7 x weekly - 1 sets - 3 reps - 10-15 sec  hold - Clamshell with Resistance  - 2 x daily - 7 x weekly - 2 sets - 10 reps - 3  sec  hold - Seated Quadratus Lumborum Stretch in Chair  - 2 x daily - 7 x weekly - 1 sets - 3 reps - 20-30 sec  hold - Sidelying Hip Abduction  - 1 x daily - 7 x weekly - 3 sets - 10 reps - 3-5 sec  hold - Side Stepping with Resistance at Thighs  - 1 x daily - 7 x weekly - Anti-Rotation Lateral Stepping with Press  - 1 x daily - 7 x weekly - 1-2 sets - 10 reps - 2-3 sec  hold - Plank with Elbows on Table  - 2 x daily - 7 x weekly - 1 sets - 3 reps - 30 sec  hold - Sit to Stand  - 2 x daily - 7 x weekly - 1 sets - 10 reps - 3-5 sec  hold  ASSESSMENT:  CLINICAL IMPRESSION: Progressing well with decreased pain and improved functional activity tolerance. Patient is now sleeping through the night without awakening due to pain. She is getting in and out of car and bed with less pain in the L LE. She has pain and tightness in the R lower ribs and back when lying on back at times. She has palpable tightness and banding in the lats on R. Tolerated gentle lat stretch supine today. Had increased R shoulder pain when tried lat stretch earlier in rehab. She has a history of 2 R shoulder surgeries. Improving ability to tightening core muscles for exercises and functional activities. Hiss with core activation with exercises improves exercise technique. Continued exercises working on core stabilization. Continued education re-importance of core activation with resolving LBP. Will add counter plank and sit to stand for home.     Eval: Patient is a 78 y.o. female who was seen today for physical therapy evaluation and treatment for low back pain. She has a history of recurrent LBP with degenerative changes in the lumbar spine. Patient presents today with pain in the L > R LB and posterior hip area. She has poor posture and alignment; limited trunk and LE mobility/ROM/strength; poor core strength for transitional movements and transfers. She has palpable tightness in L > R hip flexors, R lumbar paraspinals; lats/lumbar  paraspinals. She has pain with functional activities. Patient will benefit from PT to address problems identified.    GOALS: Goals reviewed with patient? Yes  SHORT TERM GOALS: Target date: 10/05/2023   Independent in initial HEP Baseline: Goal status: met  2.  Patient will demonstrate and verbalize improved/proper transfers and transitional movements  Baseline:  Goal status: on going    LONG TERM GOALS: Target date: 11/02/2023   Decrease pain by 75-80%  Baseline:  Goal status: on going   2.  Increased core strength and LE strength to 5/5  Baseline:  Goal status: on going   3.  Patient reports ability to walk 3-7 miles with no increase in pain  Baseline:  Goal status: on going   4.  Patient reports ability to sleep through the night without awakening due to pain  Baseline:  Goal status: met  5.  Independent in advanced HEP  Baseline:  Goal status: on going   6.  Improve Modified Oswestry Score by 10-15%  Baseline: 28/50; 56%  Goal status: on going   PLAN:  PT FREQUENCY: 2x/week  PT DURATION: 8 weeks  PLANNED INTERVENTIONS: 97164- PT Re-evaluation, 97110-Therapeutic exercises, 97530- Therapeutic activity, 97112- Neuromuscular re-education, 97535- Self Care, [id number]- Manual therapy, [phone]- Aquatic Therapy, Patient/Family education, and Taping  PLAN FOR NEXT SESSION: review and progress exercises; continue with spine care and ergonomic education; manual work and modalities as indicated    [employer], PT 10/04/2023, 2:01 PM

## 2023-10-06 ENCOUNTER — Encounter: Payer: Self-pay | Admitting: Rehabilitative and Restorative Service Providers"

## 2023-10-06 ENCOUNTER — Ambulatory Visit: Admitting: Rehabilitative and Restorative Service Providers"

## 2023-10-06 DIAGNOSIS — J479 Bronchiectasis, uncomplicated: Secondary | ICD-10-CM | POA: Diagnosis not present

## 2023-10-06 DIAGNOSIS — R293 Abnormal posture: Secondary | ICD-10-CM | POA: Diagnosis not present

## 2023-10-06 DIAGNOSIS — M545 Low back pain, unspecified: Secondary | ICD-10-CM | POA: Diagnosis not present

## 2023-10-06 DIAGNOSIS — M6281 Muscle weakness (generalized): Secondary | ICD-10-CM

## 2023-10-06 DIAGNOSIS — G8929 Other chronic pain: Secondary | ICD-10-CM | POA: Diagnosis not present

## 2023-10-06 DIAGNOSIS — M5459 Other low back pain: Secondary | ICD-10-CM

## 2023-10-06 NOTE — Therapy (Signed)
 OUTPATIENT PHYSICAL THERAPY THORACOLUMBAR TREATMENT   Patient Name: Amber Norris MRN: [id number] DOB:[birth year], 78 y.o., female Today's Date: 10/06/2023  END OF SESSION:  PT End of Session - 10/06/23 1406     Visit Number 9    Number of Visits 16    Date for PT Re-Evaluation 11/02/23    Authorization Type [employer] medicare $20 copay    Authorization Time Period 16 visits approved 09/13/23-11/02/23    Progress Note Due on Visit 10    PT Start Time 1400    PT Stop Time 1445    PT Time Calculation (min) 45 min    Activity Tolerance Patient tolerated treatment well          Past Medical History:  Diagnosis Date   Hyperlipemia    Past Surgical History:  Procedure Laterality Date   ABDOMINAL HYSTERECTOMY     FOOT SURGERY     SHOULDER SURGERY     Patient Active Problem List   Diagnosis Date Noted   Bronchiectasis without complication (HCC) 06/23/2022   Former smoker 06/23/2022    PCP: Dr Velna JONELLE Skeeter  REFERRING PROVIDER: Dr Velna JONELLE Skeeter  REFERRING DIAG: Low back pain   Rationale for Evaluation and Treatment: Rehabilitation  THERAPY DIAG:  Other low back pain  Muscle weakness (generalized)  Abnormal posture  ONSET DATE: 05/09/23 (has been about 2 years since back pain started)   SUBJECTIVE:                                                                                                                                                                                           SUBJECTIVE STATEMENT: Patient reports that she is doing well. She is sleeping through the night without waking due to pain. She is getting in the car - easier to get the L leg into the car and she seldom has to lift L leg with hands to get in the car of bed. Still has some trouble when she sits to watch a movie - she is sitting in the love seat of sofa with legs propped in front. Will have increased pain when standing from that position. Getting in and out of the floor more easily for her  exercises now. Wearing the lift when she knows she will be on her feet for any length of time.  Working on her exercises at home.    Overall improving with less back pain. She is noticing that she can getting in and out of the car and in and out the bed sometimes but not always. Sometimes has to help lift L leg into bed or car using  hands. She is working on her exercises at home. She thinks the heel lift is helping. She wears it when she is out but is still in her flip flops when she is in her house. Still has the most trouble when she gets up in the morning. Sleeps on her side. Mostly on the L side. Has tried pillow between knees. She has stopped sitting in her recliner and she is now sitting on the couch or love seat. Still sits in the recliner in the morning for her Bible reading usually about an hour.    Eval: Patient reports that she has had increased LBP since 4/25. She has had some LBP for several months or years on an intermittent basis. She was treated with medication and exercises with no significant improvement.   PERTINENT HISTORY:  Chronic LBP, osteoporosis; osteoarthritis; lung disease; lumbar fractures most recent ~ 2 yrs ago   PAIN:  Are you having pain? Yes: NPRS scale: 0/10 Pain location: L > R posterior hips and some in the low back  Pain description: dull ache - intermittent  Aggravating factors: sitting >1-2 hours; lifting  Relieving factors: standing; heating pad Pain: Pain is decreased with getting in and out of her car; in and out of bed or sit to stand after she has been sitting for any length of time.  Heel Lift: Positive response to trial of 5/8's inch lift R shoe which improved pelvic alignment. Encouraged Dagoberto use lift in shoes when she is at home and not only when she is outside the home. She wears flip flops when she is at home. (Patient demonstrated leg length difference in standing with R PSIS and iliac crest lower than L without lift.)   PRECAUTIONS: Back -  osteoporosis   WEIGHT BEARING RESTRICTIONS: No  FALLS:  Has patient fallen in last 6 months? 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  DIAGNOSTIC FINDINGS:  Xray lumbar spine: 06/22/23: Interpretation: There are mild compression fractures of the T12-L4 vertebral bodies. There is grade 1 retrolisthesis of L1 on L2 and L2 on L3. There is disc space loss that is most severe at L1-2 and L5-S1 and there is lower lumbar facet arthropathy. There is mild right convex curvature.   PATIENT SURVEYS:  Modified Oswestry: 28/50; 56%   SENSATION: WFL  MUSCLE LENGTH: Hamstrings: Right 70 deg; Left 70 deg Tight hip flexors Quads tight ~ 90 deg flex prone   POSTURE: rounded shoulders, forward head, increased lumbar lordosis, increased thoracic kyphosis, flexed trunk , and L LE/hemipelvis higher than R  PALPATION: Tender R hip flexors; L > R lumbar paraspinals; QL; lats along the crest of the pelvis   LUMBAR ROM:   AROM eval  Flexion 65% pain L LB  Extension 50% feels good   Right lateral flexion 85% pain R side  Left lateral flexion 90% pull no pain  Right rotation 50%  Left rotation 50%   (Blank rows = not tested)  LOWER EXTREMITY ROM:   tight end ranges  bilat hips   Active  Right eval Left eval  Hip flexion    Hip extension    Hip abduction    Hip adduction    Hip internal rotation    Hip external rotation    Knee flexion(prone)  90  90   Knee extension    Ankle  dorsiflexion    Ankle plantarflexion    Ankle inversion    Ankle eversion     (Blank rows = not tested)  LOWER EXTREMITY MMT:    MMT Right eval Left eval  Hip flexion 5 5  Hip extension 4 4  Hip abduction 5 4+  Hip adduction    Hip internal rotation    Hip external rotation    Knee flexion    Knee extension    Ankle dorsiflexion    Ankle plantarflexion    Ankle inversion    Ankle eversion     (Blank rows = not tested)  LUMBAR SPECIAL TESTS:  Straight leg raise test: Negative and Slump test: Negative  FUNCTIONAL TESTS:  5 times sit to stand:  SLS R with UE support 10 sec; L 5 sec with LOB     GAIT: Distance walked: 40 feet  Assistive device utilized: None Level of assistance: Complete Independence Comments: fwd flexed posture   [facility] Adult PT Treatment:                                                DATE: 10/06/23 Therapeutic Exercise: Knee to chest - single knee x 2 R/L Double knee to chest x 2 trunk rotation Neuromuscular re-ed: Core activation transverse abdominals 10 sec x 10 added hiss with core with good improvement in core activation  Therapeutic Activity: Shoulder flexion supine green TB core engaged x 10  March alternating LE's 5 sec x 10 Rt/Lt Hip abduction with blue TB alternating LE's 5 sec x 10 Rt/Lt  Bridge 3 sec x 10 with hiss  Hip abduction side steps blue TB above knees 8 feet x 5 R/L  Row blue TB 3 sec x 10 x 3 Shoulder extension blue TB 3 sec x 10 Antirotation blue 1 strip 3 sec x 10 R/L Counter plank 30 sec x 3 Sit to stand with hinged hip core engaged 5 x 2    [facility] Adult PT Treatment:                                                DATE: 10/04/23 Therapeutic Exercise: Knee to chest - single knee x 2 R/L Double knee to chest x 2  Neuromuscular re-ed: Core activation transverse abdominals 10 sec x 10 added hiss with core with good improvement in core activation  Therapeutic Activity: Shoulder flexion supine green TB core engaged x 10   March alternating LE's 5 sec x 10 Rt/Lt Hip abduction with blue TB alternating LE's 5 sec x 10 Rt/Lt  Bridge 3 sec x 10 with hiss  Hip abduction side steps blue TB above knees 8 feet x 5 R/L  Row blue TB 3 sec x 10 x 3 Shoulder extension blue TB 3 sec x 10 Antirotation blue 1 strip 3 sec x 10 R/L Counter plank 30 sec x 3 Sit  to stand with hinged hip core engaged 5 x 2   Self Care: Continue use of heel lift R shoe - encouraged patient to use shoes at home in which she can use the heel lift Encouraged continued modifications for sitting w/firmer support for back - avoid sitting with legs crossed  Sleeping positions - demonstrated and tried lying on L side pillow between knees and pillow to hug (improving)   [facility] Adult PT Treatment:                                                DATE: 09/29/23 Therapeutic Exercise: Trunk rotation in hooklying 5 sec x 5 Rt/Lt Neuromuscular re-ed: Core activation transverse abdominals 10 sec x 10 added hiss with core with good improvement in core activation  Therapeutic Activity: Heel tap 90/90 core engaged x 10 with core engaged   Shoulder flexion green TB core engaged x 10  March alternating LE's 5 sec x 10 Rt/Lt Hip abduction with blue TB alternating LE's 5 sec x 10 Rt/Lt  Bridge 3 sec x 10 with hiss  Hip abduction side steps blue TB above knees 8 feet x 5 R/L  Row blue TB 3 sec x 10 x 3 Shoulder extension blue TB 3 sec x 10 Antirotation blue 1 strip 3 sec x 10 R/L  Self Care: Continue use of heel lift R shoe - encouraged patient to use shoes at home in which she can use the heel lift Encouraged continued modifications for sitting w/firmer support for back - avoid sitting with legs crossed  Sleeping positions - demonstrated and tried lying on L side pillow between knees and pillow to hug (improving)    PATIENT EDUCATION:  Education details: POC; HEP  Person educated: Patient Education method: Explanation, Demonstration, Tactile cues, Verbal  cues, and Handouts Education comprehension: verbalized understanding, returned demonstration, verbal cues required, tactile cues required, and needs further education  HOME EXERCISE PROGRAM: Access Code: [email or web address] URL: [email or web address] Date: 10/04/2023 Prepared by: Donavan Kerlin  Program Notes lying on back hips and knees bentbring knees toward chestturn hands toward face push hands over head hold 30 sec bring arms down then bring legs down   Exercises - Supine Transversus Abdominis Bracing with Pelvic Floor Contraction  - 2 x daily - 7 x weekly - 1 sets - 10 reps - 10sec  hold - Supine Shoulder Flexion Extension AAROM with Dowel  - 1 x daily - 7 x weekly - 1 sets - 10 reps - 2-3 sec  hold - Hooklying Single Knee to Chest Stretch  - 1 x daily - 7 x weekly - 1 sets - 3-5 reps - 10-15 sec  hold - Supine Double Knee to Chest  - 1 x daily - 7 x weekly - 1 sets - 3-5 reps - 10 sec  hold - Supine Lower Trunk Rotation  - 1 x daily - 7 x weekly - 1 sets - 3-5 reps - 15-20 sec  hold - Supine March  - 1 x daily - 7 x weekly - 1-2 sets - 3 reps - 2 sec  hold - Hooklying Isometric Clamshell  - 1 x daily - 7 x weekly - 1 sets - 10 reps - 3 sec  hold - Supine Bridge  - 1 x daily - 7 x weekly - 1-2 sets - 10 reps - 3-5  sec  hold - Standing Bilateral Low Shoulder Row with Anchored Resistance  - 2 x daily - 7 x weekly - 1-3 sets - 10 reps - 2-3 sec  hold - Shoulder extension with resistance - Neutral  - 1 x daily - 7 x weekly - 1-2 sets - 10 reps - 3-5 sec  hold - Supine Quadratus Lumborum Stretch  - 1-2 x daily - 7 x weekly - 1 sets - 3 reps - 10-15 sec  hold - Clamshell with Resistance  - 2 x daily - 7 x weekly - 2 sets - 10 reps - 3 sec  hold - Seated Quadratus Lumborum Stretch in Chair  - 2 x daily - 7 x weekly - 1 sets - 3 reps - 20-30 sec  hold - Sidelying Hip Abduction  - 1 x daily - 7 x weekly - 3 sets - 10 reps - 3-5 sec  hold - Side Stepping with Resistance at Thighs  - 1 x daily -  7 x weekly - Anti-Rotation Lateral Stepping with Press  - 1 x daily - 7 x weekly - 1-2 sets - 10 reps - 2-3 sec  hold - Plank with Elbows on Table  - 2 x daily - 7 x weekly - 1 sets - 3 reps - 30 sec  hold - Sit to Stand  - 2 x daily - 7 x weekly - 1 sets - 10 reps - 3-5 sec  hold  ASSESSMENT:  CLINICAL IMPRESSION: Progressing well with decreased pain and improved functional activity tolerance. Patient is now sleeping through the night without awakening due to pain. She is getting in and out of car and bed with minimal pain in the L LE. She has less pain and tightness in the R lower ribs and back when lying on back at times. She is doing the lat stretch without increased pain in the R shoulder. She has palpable tightness and banding in the lats on R. Tolerated gentle lat stretch supine today. Continued exercises working on core stabilization. Continued education re-importance of core activation with resolving LBP. Will schedule one additional visit in 1-2 weeks and assess for discharge at that visit.     Eval: Patient is a 78 y.o. female who was seen today for physical therapy evaluation and treatment for low back pain. She has a history of recurrent LBP with degenerative changes in the lumbar spine. Patient presents today with pain in the L > R LB and posterior hip area. She has poor posture and alignment; limited trunk and LE mobility/ROM/strength; poor core strength for transitional movements and transfers. She has palpable tightness in L > R hip flexors, R lumbar paraspinals; lats/lumbar paraspinals. She has pain with functional activities. Patient will benefit from PT to address problems identified.    GOALS: Goals reviewed with patient? Yes  SHORT TERM GOALS: Target date: 10/05/2023   Independent in initial HEP Baseline: Goal status: met  2.  Patient will demonstrate and verbalize improved/proper transfers and transitional movements  Baseline:  Goal status: on going    LONG TERM  GOALS: Target date: 11/02/2023   Decrease pain by 75-80%  Baseline:  Goal status: met   2.  Increased core strength and LE strength to 5/5  Baseline:  Goal status: on going   3.  Patient reports ability to walk 3-7 miles with no increase in pain  Baseline:  Goal status: on going   4.  Patient reports ability to sleep through the night  without awakening due to pain  Baseline:  Goal status: met  5.  Independent in advanced HEP  Baseline:  Goal status: on going   6.  Improve Modified Oswestry Score by 10-15%  Baseline: 28/50; 56%  10/06/23: 7/50; 14%  Goal status: met   PLAN:  PT FREQUENCY: 2x/week  PT DURATION: 8 weeks  PLANNED INTERVENTIONS: 97164- PT Re-evaluation, 97110-Therapeutic exercises, 97530- Therapeutic activity, 97112- Neuromuscular re-education, 97535- Self Care, [id number]- Manual therapy, [phone]- Aquatic Therapy, Patient/Family education, and Taping  PLAN FOR NEXT SESSION: review and progress exercises; continue with spine care and ergonomic education; manual work and modalities as indicated    Tennelle Taflinger SHAUNNA Baptist, PT 10/06/2023, 2:42 PM [facility] at [facility] [address] [place], [place], [address] Phone: [phone]   Fax:  [phone]  Patient Details  Name: Amber Norris MRN: [id number] Date of Birth: 1945-06-03 Referring Provider:  Vernon Velna SAUNDERS, MD  Encounter Date: 10/06/2023   Florene SHAUNNA Baptist, PT 10/06/2023, 2:42 PM  [facility] at [facility] [address] [place], [place], [address] Phone: [phone]   Fax:  [phone]

## 2023-10-18 ENCOUNTER — Encounter: Payer: Self-pay | Admitting: Rehabilitative and Restorative Service Providers"

## 2023-10-18 ENCOUNTER — Ambulatory Visit: Payer: Self-pay | Attending: Internal Medicine | Admitting: Rehabilitative and Restorative Service Providers"

## 2023-10-18 DIAGNOSIS — M5459 Other low back pain: Secondary | ICD-10-CM | POA: Diagnosis not present

## 2023-10-18 DIAGNOSIS — M6281 Muscle weakness (generalized): Secondary | ICD-10-CM | POA: Diagnosis not present

## 2023-10-18 DIAGNOSIS — R293 Abnormal posture: Secondary | ICD-10-CM | POA: Insufficient documentation

## 2023-10-18 NOTE — Therapy (Signed)
 OUTPATIENT PHYSICAL THERAPY THORACOLUMBAR TREATMENT 10th visit Medicare Progress Note  And  PHYSICAL THERAPY DISCHARGE SUMMARY   Reporting Period 09/07/23 to 10/18/23  See note below for Objective Data and Assessment of Progress/Goals.    Visits from Start of Care: 10  Current functional level related to goals / functional outcomes: See progress note for discharge status   Remaining deficits: Tightness in the L > R iliacus  Pain with prolonged sitting    Education / Equipment: HEP    Patient agrees to discharge. Patient goals were partially met. Patient is being discharged due to being pleased with the current functional level.  [name] P. Ina PT, MPH 10/18/23 2:00 PM    Patient Name: Amber Norris MRN: [id number] DOB:15-Jun-1945, 78 y.o., female Today's Date: 10/18/2023  END OF SESSION:  PT End of Session - 10/18/23 1311     Visit Number 10    Number of Visits 16    Date for PT Re-Evaluation 11/02/23    Authorization Type [employer] medicare $20 copay    Authorization Time Period 16 visits approved 09/13/23-11/02/23    Progress Note Due on Visit 10    PT Start Time 1310    PT Stop Time 1355    PT Time Calculation (min) 45 min    Activity Tolerance Patient tolerated treatment well          Past Medical History:  Diagnosis Date   Hyperlipemia    Past Surgical History:  Procedure Laterality Date   ABDOMINAL HYSTERECTOMY     FOOT SURGERY     SHOULDER SURGERY     Patient Active Problem List   Diagnosis Date Noted   Bronchiectasis without complication (HCC) 06/23/2022   Former smoker 06/23/2022    PCP: Dr Velna JONELLE Skeeter  REFERRING PROVIDER: Dr Velna JONELLE Skeeter  REFERRING DIAG: Low back pain   Rationale for Evaluation and Treatment: Rehabilitation  THERAPY DIAG:  Other low back pain  Muscle weakness (generalized)  Abnormal posture  ONSET DATE: 05/09/23 (has been about 2 years since back pain started)   SUBJECTIVE:                                                                                                                                                                                            SUBJECTIVE STATEMENT: Amber Norris reports that she is doing her exercises at home. Patient reports that she is doing well. She is sleeping through the night without waking due to pain. She is getting in the car and in and out of bed without difficulty. Still has some trouble when she sits for prolonged periods of time  but that resolves with walking and moving about. She is not sitting in the love seat of sofa with legs propped in front. Getting in and out of the floor more easily for her exercises now. Wearing the lift when she knows she will be on her feet for any length of time.     Overall improving with less back pain. She is noticing that she can getting in and out of the car and in and out the bed sometimes but not always. Sometimes has to help lift L leg into bed or car using hands. She is working on her exercises at home. She thinks the heel lift is helping. She wears it when she is out but is still in her flip flops when she is in her house. Still has the most trouble when she gets up in the morning. Sleeps on her side. Mostly on the L side. Has tried pillow between knees. She has stopped sitting in her recliner and she is now sitting on the couch or love seat. Still sits in the recliner in the morning for her Bible reading usually about an hour.    Eval: Patient reports that she has had increased LBP since 4/25. She has had some LBP for several months or years on an intermittent basis. She was treated with medication and exercises with no significant improvement.   PERTINENT HISTORY:  Chronic LBP, osteoporosis; osteoarthritis; lung disease; lumbar fractures most recent ~ 2 yrs ago   PAIN:  Are you having pain? Yes: NPRS scale: 0/10 Pain location: L > R posterior hips and some in the low back  Pain description: dull ache - intermittent  Aggravating  factors: sitting >1-2 hours; lifting  Relieving factors: standing; heating pad Pain: Pain is decreased with getting in and out of her car; in and out of bed or sit to stand after she has been sitting for any length of time.  Heel Lift: Positive response to trial of 5/8's inch lift R shoe which improved pelvic alignment. Encouraged Amber Norris use lift in shoes when she is at home and not only when she is outside the home. She wears flip flops when she is at home. (Patient demonstrated leg length difference in standing with R PSIS and iliac crest lower than L without lift.)   PRECAUTIONS: Back - osteoporosis   WEIGHT BEARING RESTRICTIONS: No  FALLS:  Has patient fallen in last 6 months? 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  DIAGNOSTIC FINDINGS:  Xray lumbar spine: 06/22/23: Interpretation: There are mild compression fractures of the T12-L4 vertebral bodies. There is grade 1 retrolisthesis of L1 on L2 and L2 on L3. There is disc space loss that is most severe at L1-2 and L5-S1 and there is lower lumbar facet arthropathy. There is mild right convex curvature.   PATIENT SURVEYS:  Modified Oswestry: 28/50; 56%   SENSATION: WFL  MUSCLE LENGTH: Hamstrings: Right 70 deg; Left 70 deg Tight hip flexors Quads tight ~ 90 deg flex prone   POSTURE:  rounded shoulders, forward head, increased lumbar lordosis, increased thoracic kyphosis, flexed trunk , and L LE/hemipelvis higher than  R  PALPATION: Tender R hip flexors; L > R lumbar paraspinals; QL; lats along the crest of the pelvis   LUMBAR ROM:   AROM eval 10/18/23  Flexion 65% pain L LB 80%  Extension 50% feels good  60%  Right lateral flexion 85% pain R side 100% pull  Left lateral flexion 90% pull no pain 100% pull   Right rotation 50% 60%  Left rotation 50% 60%   (Blank rows = not tested)  LOWER EXTREMITY ROM:   tight end ranges bilat hips   Active  Right eval Right  10/18/23 Left eval Left  10/18/23  Hip flexion      Hip extension      Hip abduction      Hip adduction      Hip internal rotation      Hip external rotation      Knee flexion(prone)  90  100 90  100  Knee extension      Ankle dorsiflexion      Ankle plantarflexion      Ankle inversion      Ankle eversion       (Blank rows = not tested)  LOWER EXTREMITY MMT:    MMT Right eval Right  10/18/23 Left eval Left  10/18/23  Hip flexion 5 5 5 5   Hip extension 4 4+ 4 4+  Hip abduction 5 5 4+ 5  Hip adduction      Hip internal rotation      Hip external rotation      Knee flexion      Knee extension      Ankle dorsiflexion      Ankle plantarflexion      Ankle inversion      Ankle eversion       (Blank rows = not tested)  LUMBAR SPECIAL TESTS:  Straight leg raise test: Negative and Slump test: Negative  FUNCTIONAL TESTS:  5 times sit to stand:  SLS R with UE support 10 sec; L 5 sec with LOB   10/18/23: R/L with no UE support 10 sec each side    GAIT: Distance walked: 40 feet  Assistive device utilized: None Level of assistance: Complete Independence Comments: fwd flexed posture   [facility] Adult PT Treatment:                                                DATE: 10/18/23 Therapeutic Exercise: Knee to chest - single knee x 2 R/L Double knee to chest x 2 trunk rotation Manual:   Deep tissue work/trigger point release iliacus bilat 9 increased tightness R compared to L  Neuromuscular re-ed: Core activation transverse  abdominals 10 sec x 10 added hiss with core with good improvement in core activation  Therapeutic Activity: Shoulder flexion supine green TB core engaged x 10  March alternating LE's 5 sec x 10 Rt/Lt Hip abduction with blue TB alternating LE's 5 sec x 10 Rt/Lt  Bridge 3 sec x 10 with hiss  Hip abduction side steps blue TB above knees 8 feet x 5 R/L  Row blue TB 3 sec x 10 x 3 Shoulder extension blue TB 3 sec x 10 Antirotation blue 1 strip 3 sec x 10 R/L Counter plank 30  sec x 3 Sit to stand with hinged hip core engaged 5 x 2   [facility] Adult PT Treatment:                                                DATE: 10/06/23 Therapeutic Exercise: Knee to chest - single knee x 2 R/L Double knee to chest x 2 trunk rotation Neuromuscular re-ed: Core activation transverse abdominals 10 sec x 10 added hiss with core with good improvement in core activation  Therapeutic Activity: Shoulder flexion supine green TB core engaged x 10  March alternating LE's 5 sec x 10 Rt/Lt Hip abduction with blue TB alternating LE's 5 sec x 10 Rt/Lt  Bridge 3 sec x 10 with hiss  Hip abduction side steps blue TB above knees 8 feet x 5 R/L  Row blue TB 3 sec x 10 x 3 Shoulder extension blue TB 3 sec x 10 Antirotation blue 1 strip 3 sec x 10 R/L Counter plank 30 sec x 3 Sit to stand with hinged hip core engaged 5 x 2    [facility] Adult PT Treatment:                                                DATE: 10/04/23 Therapeutic Exercise: Knee to chest - single knee x 2 R/L Double knee to chest x 2  Neuromuscular re-ed: Core activation transverse abdominals 10 sec x 10 added hiss with core with good improvement in core activation  Therapeutic Activity: Shoulder flexion supine green TB core engaged x 10  March alternating LE's 5 sec x 10 Rt/Lt Hip abduction with blue TB alternating LE's 5 sec x 10 Rt/Lt  Bridge 3 sec x 10 with hiss  Hip abduction side steps blue TB above knees 8 feet x 5 R/L  Row blue TB 3 sec x 10 x 3 Shoulder  extension blue TB 3 sec x 10 Antirotation blue 1 strip 3 sec x 10 R/L Counter plank 30 sec x 3 Sit to stand with hinged hip core engaged 5 x 2   Self Care: Continue use of heel lift R shoe - encouraged patient to use shoes at home in which she can use the heel lift Encouraged continued modifications for sitting w/firmer support for back - avoid sitting with legs crossed  Sleeping positions - demonstrated and tried lying on L side pillow between knees and pillow to hug (improving)   [facility] Adult PT Treatment:                                                DATE: 09/29/23 Therapeutic Exercise: Trunk rotation in hooklying 5 sec x 5 Rt/Lt Neuromuscular re-ed: Core activation transverse abdominals 10 sec x 10 added hiss with core with good improvement in core activation  Therapeutic Activity: Heel tap 90/90 core engaged x 10 with core engaged   Shoulder flexion green TB core engaged x 10  March alternating LE's 5 sec x 10 Rt/Lt Hip abduction with blue TB alternating LE's 5 sec x 10 Rt/Lt  Bridge 3 sec x 10 with hiss  Hip abduction side steps blue TB above knees 8 feet x 5 R/L  Row blue TB 3 sec x 10 x 3 Shoulder extension blue TB 3 sec x 10 Antirotation blue 1 strip 3 sec x 10 R/L  Self Care: Continue use of heel lift R shoe - encouraged patient to use shoes at home in which she can use the heel lift Encouraged continued modifications for sitting w/firmer support for back - avoid sitting with legs crossed  Sleeping positions - demonstrated and tried lying on L side pillow between knees and pillow to hug (improving)    PATIENT EDUCATION:  Education details: POC; HEP  Person educated: Patient Education method: Explanation, Demonstration, Tactile cues, Verbal cues, and Handouts Education comprehension: verbalized understanding, returned demonstration, verbal cues required, tactile cues required, and needs further education  HOME EXERCISE PROGRAM: Access Code: [email or web address] URL:  [email or web address] Date: 10/18/2023 Prepared by: Tearra Ouk  Program Notes lying on back hips and knees bentbring knees toward chestturn hands toward face push hands over head hold 30 sec bring arms down then bring legs down DEEP TISSUE RELEASE lyin on back knees supported on pillow pinch iliacus muscle between thumbs and fingers at the front hip bone hold 60-90 sec once a day BALL RELEASE WORKlying on stomach place 4 inch plastic ball between your belly button and front hip bone  for 1-2 min once a day   Exercises - Supine Transversus Abdominis Bracing with Pelvic Floor Contraction  - 2 x daily - 7 x weekly - 1 sets - 10 reps - 10sec  hold - Supine Shoulder Flexion Extension AAROM with Dowel  - 1 x daily - 7 x weekly - 1 sets - 10 reps - 2-3 sec  hold - Hooklying Single Knee to Chest Stretch  - 1 x daily - 7 x weekly - 1 sets - 3-5 reps - 10-15 sec  hold - Supine Double Knee to Chest  - 1 x daily - 7 x weekly - 1 sets - 3-5 reps - 10 sec  hold - Supine Lower Trunk Rotation  - 1 x daily - 7 x weekly - 1 sets - 3-5 reps - 15-20 sec  hold - Supine March  - 1 x daily - 7 x weekly - 1-2 sets - 3 reps - 2 sec  hold - Hooklying Isometric Clamshell  - 1 x daily - 7 x weekly - 1 sets - 10 reps - 3 sec  hold - Supine Bridge  - 1 x daily - 7 x weekly - 1-2 sets - 10 reps - 3-5 sec  hold - Standing Bilateral Low Shoulder Row with Anchored Resistance  - 2 x daily - 7 x weekly - 1-3 sets - 10 reps - 2-3 sec  hold - Shoulder extension with resistance - Neutral  - 1 x daily - 7 x weekly - 1-2 sets - 10 reps - 3-5 sec  hold - Supine Quadratus Lumborum Stretch  - 1-2 x daily - 7 x weekly - 1 sets - 3 reps - 10-15 sec  hold - Clamshell with Resistance  - 2 x daily - 7 x weekly - 2 sets - 10 reps - 3 sec  hold - Seated Quadratus Lumborum Stretch in Chair  - 2 x daily - 7 x weekly - 1 sets - 3 reps - 20-30 sec  hold - Sidelying Hip Abduction  - 1 x daily - 7 x weekly - 3 sets - 10 reps - 3-5 sec  hold - Side Stepping with Resistance at Thighs  - 1 x daily - 7 x weekly - Anti-Rotation Lateral Stepping with Press  - 1 x daily - 7 x weekly - 1-2 sets - 10 reps - 2-3 sec  hold - Plank with Elbows on Table  - 2 x daily - 7 x weekly - 1 sets - 3 reps - 30 sec  hold - Sit to Stand  - 2 x daily - 7 x weekly - 1 sets - 10 reps - 3-5 sec  hold  ASSESSMENT:  CLINICAL IMPRESSION: Excellent progress with rehab. Progressing well with decreased pain and improved functional activity tolerance. Patient is now sleeping through the night without awakening due to pain. She is getting in and out of car and bed with no pain in the L LE. She has less pain and tightness in the R lower ribs and back when lying on back at times. She is doing the lat stretch without increased pain in the R shoulder. She has palpable tightness and banding in the lats on R. Tolerated gentle lat stretch supine today. Continued exercises working on core stabilization. Continued education re-importance of core activation with resolving LBP.     Eval: Patient is a 78 y.o. female who was seen today for physical therapy evaluation and treatment for low back pain. She has a history of recurrent LBP with degenerative changes in the lumbar spine. Patient presents today with pain in the L > R LB and posterior hip area. She has poor posture and alignment; limited trunk and LE mobility/ROM/strength; poor core strength for transitional movements and transfers. She has palpable tightness in L > R hip flexors, R lumbar paraspinals; lats/lumbar paraspinals. She has pain with functional activities. Patient will benefit from PT to address problems identified.    GOALS: Goals reviewed with patient? Yes  SHORT TERM GOALS: Target date: 10/05/2023   Independent in initial HEP Baseline: Goal status: met  2.  Patient will demonstrate and verbalize improved/proper transfers and transitional movements  Baseline:  Goal status: met    LONG TERM GOALS:  Target date: 11/02/2023   Decrease pain by 75-80%  Baseline:  Goal status: met   2.  Increased core strength and LE strength to 5/5  Baseline:  Goal status: partially met  3.  Patient reports ability to walk 3-7 miles with no increase in pain  Baseline: now walking at least 2 miles with no increased pain  Goal status: on going   4.  Patient reports ability to sleep through the night without awakening due to pain  Baseline:  Goal status: met  5.  Independent in advanced HEP  Baseline:  Goal status: met   6.  Improve Modified Oswestry Score by 10-15%  Baseline: 28/50; 56%  10/06/23: 7/50; 14%  Goal status: met   PLAN:  PT FREQUENCY: 2x/week  PT DURATION: 8 weeks  PLANNED INTERVENTIONS: 97164- PT Re-evaluation, 97110-Therapeutic exercises, 97530- Therapeutic activity, 97112- Neuromuscular re-education, 97535- Self Care, [id number]- Manual therapy, [phone]- Aquatic Therapy, Patient/Family education, and Taping  PLAN FOR NEXT SESSION: discharge to independent HEP    Amber Norris Amber Norris Baptist, PT 10/18/2023, 1:11 PM [place] [address] [place], [place], [address] Phone: [phone]   Fax:  [phone]  Patient Details  Name: Amber Norris MRN: [id number] Date of Birth: 09-14-1945 Referring Provider:  Vernon Velna SAUNDERS, MD  Encounter Date: 10/18/2023   Amber Norris Amber Norris Baptist, PT 10/18/2023, 1:11  PM  [place] [facility] [address] [place], [place], [address] Phone: [phone]   Fax:  [phone]

## 2023-10-27 DIAGNOSIS — H401131 Primary open-angle glaucoma, bilateral, mild stage: Secondary | ICD-10-CM | POA: Diagnosis not present

## 2023-11-01 ENCOUNTER — Ambulatory Visit

## 2023-11-01 DIAGNOSIS — J479 Bronchiectasis, uncomplicated: Secondary | ICD-10-CM | POA: Diagnosis not present

## 2023-11-01 DIAGNOSIS — J8410 Pulmonary fibrosis, unspecified: Secondary | ICD-10-CM | POA: Diagnosis not present

## 2023-11-01 DIAGNOSIS — R918 Other nonspecific abnormal finding of lung field: Secondary | ICD-10-CM | POA: Diagnosis not present

## 2023-11-03 DIAGNOSIS — E7800 Pure hypercholesterolemia, unspecified: Secondary | ICD-10-CM | POA: Diagnosis not present

## 2023-11-03 DIAGNOSIS — R300 Dysuria: Secondary | ICD-10-CM | POA: Diagnosis not present

## 2023-11-03 DIAGNOSIS — J479 Bronchiectasis, uncomplicated: Secondary | ICD-10-CM | POA: Diagnosis not present

## 2023-11-03 DIAGNOSIS — R350 Frequency of micturition: Secondary | ICD-10-CM | POA: Diagnosis not present

## 2024-01-14 ENCOUNTER — Other Ambulatory Visit: Payer: Self-pay | Admitting: Nurse Practitioner

## 2024-01-14 DIAGNOSIS — J479 Bronchiectasis, uncomplicated: Secondary | ICD-10-CM

## 2024-04-21 ENCOUNTER — Ambulatory Visit (HOSPITAL_BASED_OUTPATIENT_CLINIC_OR_DEPARTMENT_OTHER): Admitting: Pulmonary Disease
